# Patient Record
Sex: Male | Born: 1990 | Race: White | Hispanic: No | Marital: Single | Smoking: Never smoker
Health system: Southern US, Community
[De-identification: ages and names within clinical notes are randomized; demographics above are authoritative.]

## PROBLEM LIST (undated history)

## (undated) DIAGNOSIS — S060X9A Concussion with loss of consciousness of unspecified duration, initial encounter: Secondary | ICD-10-CM

## (undated) DIAGNOSIS — IMO0002 Reserved for concepts with insufficient information to code with codable children: Secondary | ICD-10-CM

## (undated) DIAGNOSIS — S24103A Unspecified injury at T7-T10 level of thoracic spinal cord, initial encounter: Secondary | ICD-10-CM

## (undated) DIAGNOSIS — F4310 Post-traumatic stress disorder, unspecified: Secondary | ICD-10-CM

## (undated) DIAGNOSIS — S060XAA Concussion with loss of consciousness status unknown, initial encounter: Secondary | ICD-10-CM

## (undated) DIAGNOSIS — K592 Neurogenic bowel, not elsewhere classified: Secondary | ICD-10-CM

## (undated) DIAGNOSIS — S14109A Unspecified injury at unspecified level of cervical spinal cord, initial encounter: Secondary | ICD-10-CM

## (undated) DIAGNOSIS — G904 Autonomic dysreflexia: Secondary | ICD-10-CM

## (undated) DIAGNOSIS — N319 Neuromuscular dysfunction of bladder, unspecified: Secondary | ICD-10-CM

## (undated) DIAGNOSIS — S24102A Unspecified injury at T2-T6 level of thoracic spinal cord, initial encounter: Secondary | ICD-10-CM

## (undated) DIAGNOSIS — N4400 Torsion of testis, unspecified: Secondary | ICD-10-CM

## (undated) HISTORY — PX: OTHER SURGICAL HISTORY: SHX169

## (undated) HISTORY — PX: FOOT SURGERY: SHX648

## (undated) HISTORY — PX: TYMPANOSTOMY TUBE PLACEMENT: SHX32

## (undated) HISTORY — PX: CALCANEAL OSTEOTOMY W/ INTERNAL FIXATION: SHX1282

## (undated) HISTORY — PX: KNEE SURGERY: SHX244

## (undated) HISTORY — PX: OSTEOTOMY TARSAL: SUR991

## (undated) HISTORY — PX: TESTICLE SURGERY: SHX794

## (undated) HISTORY — PX: GASTROCNEMIUS RECESSION: SUR1071

## (undated) HISTORY — PX: NECK SURGERY: SHX720

## (undated) HISTORY — PX: POSTERIOR FUSION CERVICAL SPINE: SUR628

---
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
## 2011-06-14 ENCOUNTER — Encounter: Payer: Self-pay | Admitting: *Deleted

## 2011-06-14 ENCOUNTER — Emergency Department (HOSPITAL_BASED_OUTPATIENT_CLINIC_OR_DEPARTMENT_OTHER)
Admission: EM | Admit: 2011-06-14 | Discharge: 2011-06-14 | Disposition: A | Discharge status: Home / Self Care | Payer: BC Managed Care – PPO | Attending: Emergency Medicine | Admitting: Emergency Medicine

## 2011-06-14 ENCOUNTER — Emergency Department (INDEPENDENT_AMBULATORY_CARE_PROVIDER_SITE_OTHER): Payer: BC Managed Care – PPO

## 2011-06-14 DIAGNOSIS — N509 Disorder of male genital organs, unspecified: Secondary | ICD-10-CM | POA: Insufficient documentation

## 2011-06-14 DIAGNOSIS — N50819 Testicular pain, unspecified: Secondary | ICD-10-CM

## 2011-06-14 DIAGNOSIS — N508 Other specified disorders of male genital organs: Secondary | ICD-10-CM

## 2011-06-14 HISTORY — DX: Torsion of testis, unspecified: N44.00

## 2011-06-14 LAB — URINALYSIS, ROUTINE W REFLEX MICROSCOPIC
Leukocytes, UA: NEGATIVE
Protein, ur: 30 mg/dL — AB
Urobilinogen, UA: 0.2 mg/dL (ref 0.0–1.0)

## 2011-06-14 LAB — URINE MICROSCOPIC-ADD ON

## 2011-06-14 NOTE — ED Provider Notes (Signed)
History     CSN: [id number] Arrival date & time: 06/14/2011  6:32 PM   Chief Complaint  Patient presents with  . Testicle Pain     (Include location/radiation/quality/duration/timing/severity/associated sxs/prior treatment) HPI Pt reports 2 days of gradually worsening R testicular pain, sharp, severe, worse with walking and movement. Has history of testicular torsion repair on that side about a year ago. Feels similar. Since his prior surgery he has also had difficulty with urinary retention, has to self-cath daily. Recently seen at [facility] for same, awaiting further workup there.   Past Medical History  Diagnosis Date  . Testicular torsion      Past Surgical History  Procedure Date  . Testicle surgery     History reviewed. No pertinent family history.  History  Substance Use Topics  . Smoking status: Never Smoker   . Smokeless tobacco: Not on file  . Alcohol Use: No      Review of Systems All other systems reviewed and are negative except as noted in HPI.   Allergies  Nabumetone  Home Medications  No current outpatient prescriptions on file.  Physical Exam    BP 150/77  Pulse 94  Temp(Src) 98.4 F (36.9 C) (Oral)  Resp 16  Ht 5\' 7"  (1.702 m)  Wt 211 lb (95.709 kg)  BMI 33.05 kg/m2  Physical Exam  Nursing note and vitals reviewed. Constitutional: He is oriented to person, place, and time. He appears well-developed and well-nourished.  HENT:  Head: Normocephalic and atraumatic.  Eyes: EOM are normal. Pupils are equal, round, and reactive to light.  Neck: Normal range of motion. Neck supple.  Cardiovascular: Normal rate, normal heart sounds and intact distal pulses.   Pulmonary/Chest: Effort normal and breath sounds normal.  Abdominal: Bowel sounds are normal. He exhibits no distension. There is no tenderness.  Genitourinary:       R testicular tenderness, normal orientation of testes, no masses, cremasteric reflex absent bilaterally due to contracted  scrotum, no penile discharge, no hernia  Musculoskeletal: Normal range of motion. He exhibits no edema and no tenderness.  Neurological: He is alert and oriented to person, place, and time. He has normal strength. No cranial nerve deficit or sensory deficit.  Skin: Skin is warm and dry. No rash noted.  Psychiatric: He has a normal mood and affect.    ED Course  Procedures  Results for orders placed during the hospital encounter of 06/14/11  URINALYSIS, ROUTINE W REFLEX MICROSCOPIC      Component Value Range   Color, Urine YELLOW  YELLOW    Appearance CLEAR  CLEAR    Specific Gravity, Urine 1.026  1.005 - 1.030    pH 6.0  5.0 - 8.0    Glucose, UA NEGATIVE  NEGATIVE (mg/dL)   Hgb urine dipstick NEGATIVE  NEGATIVE    Bilirubin Urine NEGATIVE  NEGATIVE    Ketones, ur NEGATIVE  NEGATIVE (mg/dL)   Protein, ur 30 (*) NEGATIVE (mg/dL)   Urobilinogen, UA 0.2  0.0 - 1.0 (mg/dL)   Nitrite NEGATIVE  NEGATIVE    Leukocytes, UA NEGATIVE  NEGATIVE   URINE MICROSCOPIC-ADD ON      Component Value Range   Squamous Epithelial / LPF RARE  RARE    Bacteria, UA RARE  RARE    US Scrotum  06/14/2011  *RADIOLOGY REPORT*  Clinical Data:  Right scrotal pain for 2 days.  History of testicular torsion surgically repaired 1 year ago.  SCROTAL ULTRASOUND DOPPLER ULTRASOUND OF THE TESTICLES  Technique: Complete ultrasound examination of the testicles, epididymis, and other scrotal structures was performed.  Color and spectral Doppler ultrasound were also utilized to evaluate blood flow to the testicles.  Comparison:  None available.  Findings:  Right testis:  Normal.  3.5 x 2.0 x 2.8 cm.  Left testis:  Normal.  3.5 x 2.1 x 2.7 cm.  Right epididymis:  Normal in size and appearance.  Left epididymis:  Normal in size and appearance.  Hydocele:  None present.  Varicocele:  None present.  Pulsed Doppler interrogation of both testes demonstrates low resistance flow bilaterally.  IMPRESSION: Negative bilateral scrotal  ultrasound.  Original Report Authenticated By: Jamesetta Orleans. MATTERN, M.D.   [place] Art/ven Flow Abd Pelv Doppler  06/14/2011  *RADIOLOGY REPORT*  Clinical Data:  Right scrotal pain for 2 days.  History of testicular torsion surgically repaired 1 year ago.  SCROTAL ULTRASOUND DOPPLER ULTRASOUND OF THE TESTICLES  Technique: Complete ultrasound examination of the testicles, epididymis, and other scrotal structures was performed.  Color and spectral Doppler ultrasound were also utilized to evaluate blood flow to the testicles.  Comparison:  None available.  Findings:  Right testis:  Normal.  3.5 x 2.0 x 2.8 cm.  Left testis:  Normal.  3.5 x 2.1 x 2.7 cm.  Right epididymis:  Normal in size and appearance.  Left epididymis:  Normal in size and appearance.  Hydocele:  None present.  Varicocele:  None present.  Pulsed Doppler interrogation of both testes demonstrates low resistance flow bilaterally.  IMPRESSION: Negative bilateral scrotal ultrasound.  Original Report Authenticated By: Jamesetta Orleans. MATTERN, M.D.       MDM Pt's UA and [place] are both normal. Pt advised to support scrotum, take pain medications as needed, followup with urology if pain persists.        Charles B. Bernette Mayers, MD 06/14/11 2039

## 2011-06-14 NOTE — ED Notes (Signed)
Pt c/o right testicle pain, HX testicle tortion

## 2011-06-16 LAB — URINE CULTURE

## 2011-06-17 NOTE — ED Notes (Signed)
+   URINE CULTURE. CHART SENT TO EDP OFFICE FOR REVIEW

## 2012-08-26 DIAGNOSIS — R569 Unspecified convulsions: Secondary | ICD-10-CM | POA: Insufficient documentation

## 2013-01-25 DIAGNOSIS — F909 Attention-deficit hyperactivity disorder, unspecified type: Secondary | ICD-10-CM | POA: Insufficient documentation

## 2013-05-06 ENCOUNTER — Encounter (HOSPITAL_COMMUNITY): Payer: Self-pay | Admitting: *Deleted

## 2013-05-06 ENCOUNTER — Other Ambulatory Visit: Payer: Self-pay | Admitting: Urology

## 2013-05-06 ENCOUNTER — Emergency Department (HOSPITAL_COMMUNITY)
Admission: EM | Admit: 2013-05-06 | Discharge: 2013-05-06 | Disposition: A | Discharge status: Home / Self Care | Payer: BC Managed Care – PPO | Attending: Emergency Medicine | Admitting: Emergency Medicine

## 2013-05-06 DIAGNOSIS — Z8669 Personal history of other diseases of the nervous system and sense organs: Secondary | ICD-10-CM | POA: Insufficient documentation

## 2013-05-06 DIAGNOSIS — Z4789 Encounter for other orthopedic aftercare: Secondary | ICD-10-CM

## 2013-05-06 DIAGNOSIS — Z79899 Other long term (current) drug therapy: Secondary | ICD-10-CM | POA: Insufficient documentation

## 2013-05-06 DIAGNOSIS — Z87448 Personal history of other diseases of urinary system: Secondary | ICD-10-CM | POA: Insufficient documentation

## 2013-05-06 DIAGNOSIS — R109 Unspecified abdominal pain: Secondary | ICD-10-CM

## 2013-05-06 DIAGNOSIS — Z4689 Encounter for fitting and adjustment of other specified devices: Secondary | ICD-10-CM | POA: Insufficient documentation

## 2013-05-06 DIAGNOSIS — R209 Unspecified disturbances of skin sensation: Secondary | ICD-10-CM | POA: Insufficient documentation

## 2013-05-06 DIAGNOSIS — N319 Neuromuscular dysfunction of bladder, unspecified: Secondary | ICD-10-CM

## 2013-05-06 DIAGNOSIS — M79609 Pain in unspecified limb: Secondary | ICD-10-CM | POA: Insufficient documentation

## 2013-05-06 HISTORY — DX: Neuromuscular dysfunction of bladder, unspecified: N31.9

## 2013-05-06 NOTE — ED Notes (Signed)
Pt states that he had surgery on Friday to repair his foot; pt states that for 2 days he has had swelling and decreased feeling to his rt foot; pt states "My foot feels dead"; pt reports that he called his surgeon today and reports that he was instructed to come to the ER to have the cast redone; Pt had surgery at [facility].

## 2013-05-06 NOTE — ED Provider Notes (Signed)
CSN: [id number]     Arrival date & time 05/06/13  1912 History     First MD Initiated Contact with Patient 05/06/13 1932     Chief Complaint  Patient presents with  . Post-op Problem   (Consider location/radiation/quality/duration/timing/severity/associated sxs/prior Treatment) HPI Comments: Patient presents today with a complaint that his cast is too tight.  He has reconstructive surgery done on the right foot four days ago and had the cast placed then.  Surgery was performed by Dr. Lorin Picket with [facility].  He states that ever since the cast was placed his right foot had felt numb and that he has had pain in the achilles area of his foot.  He has been taking Oxycodone for the pain, which is helping.  He states that he called the office of Dr. Lorin Picket earlier today and was told to come to the ED to have the cast removed.  He states that he has an appointment scheduled with his Orthopedist tomorrow.  He denies fever or chills.    The history is provided by the patient.    Past Medical History  Diagnosis Date  . Testicular torsion   . Muscular dystrophy   . Neurogenic bladder disorder    Past Surgical History  Procedure Laterality Date  . Testicle surgery    . Foot surgery    . Knee surgery    . Neck surgery     No family history on file. History  Substance Use Topics  . Smoking status: Never Smoker   . Smokeless tobacco: Not on file  . Alcohol Use: No    Review of Systems  Musculoskeletal:       Right foot pain  Neurological: Positive for numbness.  All other systems reviewed and are negative.    Allergies  Citalopram; Nabumetone; Nitrofurantoin monohyd macro; and Tizanidine  Home Medications   Current Outpatient Rx  Name  Route  Sig  Dispense  Refill  . baclofen (LIORESAL) 10 MG tablet   Oral   Take 10 mg by mouth 3 (three) times daily.         Marland Kitchen oxyCODONE (OXYCONTIN) 20 MG 12 hr tablet   Oral   Take 20 mg by mouth every 12 (twelve) hours.         .  Oxycodone HCl 10 MG TABS   Oral   Take 10 mg by mouth every 4 (four) hours as needed (for pain).         . promethazine (PHENERGAN) 12.5 MG tablet   Oral   Take 12.5 mg by mouth every 6 (six) hours as needed for nausea.         . vitamin B-12 (CYANOCOBALAMIN) 1000 MCG tablet   Oral   Take 1,000 mcg by mouth daily.          BP 142/101  Pulse 100  Temp(Src) 98.3 F (36.8 C) (Oral)  Resp 20  Ht 5\' 6"  (1.676 m)  Wt 208 lb (94.348 kg)  BMI 33.59 kg/m2  SpO2 98% Physical Exam  Nursing note and vitals reviewed. Constitutional: He appears well-developed and well-nourished.  HENT:  Head: Normocephalic and atraumatic.  Mouth/Throat: Oropharynx is clear and moist.  Cardiovascular: Normal rate, regular rhythm and normal heart sounds.   Pulmonary/Chest: Effort normal and breath sounds normal.  Neurological: He is alert.  Decreased sensation of all toes of the right foot  Skin: Skin is warm and dry.  Case of the right foot and lower leg intact.  Good  capillary refill of all toes of the right foot.  Toes are warm and dry.  Normal in color.  Psychiatric: He has a normal mood and affect.    ED Course   Procedures (including critical care time)  Labs Reviewed - No data to display No results found. No diagnosis found.  Side of the cast cut by Orthopedic Tech and wrapped with ACE wrap.  Cast kept in place.  After cast cut the patient was able to wiggle all toes and sensation of all toes of the right foot was intact.  MDM  Patient presents with a complaint that his cast is too tight and is causing discomfort of his foot and numbness of his toes.  Orthopedic tech evaluated the cast and made a cut along the side of the cast.  Case was not removed.  After this was done, the patient reported that his discomfort had significantly improved and his distal sensation of all toes on the right foot were intact.  Patient afebrile.  Patient has follow up appointment with his Orthopedist tomorrow.     Pascal Lux [place], PA-C 05/07/13 1243

## 2013-05-07 NOTE — ED Provider Notes (Signed)
Medical screening examination/treatment/procedure(s) were performed by non-physician practitioner and as supervising physician I was immediately available for consultation/collaboration.   Saim Almanza L Brexlee Heberlein, MD 05/07/13 2305 

## 2013-05-12 ENCOUNTER — Ambulatory Visit (HOSPITAL_COMMUNITY)
Admission: RE | Admit: 2013-05-12 | Discharge: 2013-05-12 | Disposition: A | Discharge status: Home / Self Care | Payer: BC Managed Care – PPO | Source: Ambulatory Visit | Attending: Urology | Admitting: Urology

## 2013-05-12 DIAGNOSIS — N319 Neuromuscular dysfunction of bladder, unspecified: Secondary | ICD-10-CM | POA: Insufficient documentation

## 2013-05-12 DIAGNOSIS — R109 Unspecified abdominal pain: Secondary | ICD-10-CM

## 2013-05-12 MED ORDER — DIATRIZOATE MEGLUMINE 30 % UR SOLN
Freq: Once | URETHRAL | Status: AC | PRN
  Administered 2013-05-12: 300 mL

## 2013-11-28 ENCOUNTER — Emergency Department (HOSPITAL_COMMUNITY): Payer: BC Managed Care – PPO

## 2013-11-28 ENCOUNTER — Encounter (HOSPITAL_COMMUNITY): Payer: Self-pay | Admitting: Emergency Medicine

## 2013-11-28 ENCOUNTER — Emergency Department (HOSPITAL_COMMUNITY)
Admission: EM | Admit: 2013-11-28 | Discharge: 2013-11-28 | Disposition: A | Discharge status: Home / Self Care | Payer: BC Managed Care – PPO | Attending: Emergency Medicine | Admitting: Emergency Medicine

## 2013-11-28 DIAGNOSIS — Z79899 Other long term (current) drug therapy: Secondary | ICD-10-CM | POA: Insufficient documentation

## 2013-11-28 DIAGNOSIS — Z791 Long term (current) use of non-steroidal anti-inflammatories (NSAID): Secondary | ICD-10-CM | POA: Insufficient documentation

## 2013-11-28 DIAGNOSIS — R202 Paresthesia of skin: Secondary | ICD-10-CM

## 2013-11-28 DIAGNOSIS — Z87448 Personal history of other diseases of urinary system: Secondary | ICD-10-CM | POA: Insufficient documentation

## 2013-11-28 DIAGNOSIS — Z8669 Personal history of other diseases of the nervous system and sense organs: Secondary | ICD-10-CM | POA: Insufficient documentation

## 2013-11-28 DIAGNOSIS — R209 Unspecified disturbances of skin sensation: Secondary | ICD-10-CM | POA: Insufficient documentation

## 2013-11-28 DIAGNOSIS — R200 Anesthesia of skin: Secondary | ICD-10-CM

## 2013-11-28 MED ORDER — LORAZEPAM 1 MG PO TABS
1.0000 mg | ORAL_TABLET | Freq: Once | ORAL | Status: DC
  Filled 2013-11-28: qty 1

## 2013-11-28 NOTE — ED Provider Notes (Signed)
CSN: [id number]     Arrival date & time 11/28/13  9562 History   First MD Initiated Contact with Patient 11/28/13 [phone]     Chief Complaint  Patient presents with  . Numbness     (Consider location/radiation/quality/duration/timing/severity/associated sxs/prior Treatment) HPI  This is a 23 year old male with history of muscular dystrophy and spasticity who presents with right facial and arm numbness. Patient reports onset of symptoms yesterday. He denies any weakness or difficulty speaking. He is followed by neurology at [facility] and they advised him to seek emergent evaluation. Patient denies any speech difficulties. He denies any facial asymmetry. He states that his right face and arm "felt tingly and numb." He denies any decreased grip strength. He has a recent fevers or illnesses. He denies any neck pain.  Past Medical History  Diagnosis Date  . Testicular torsion   . Muscular dystrophy   . Neurogenic bladder disorder    Past Surgical History  Procedure Laterality Date  . Testicle surgery    . Foot surgery    . Knee surgery    . Neck surgery    . Tympanostomy tube placement    . Hip surgery    . Posterior fusion cervical spine    . Calcaneal osteotomy w/ internal fixation    . Gastrocnemius recession    . Osteotomy tarsal     No family history on file. History  Substance Use Topics  . Smoking status: Never Smoker   . Smokeless tobacco: Not on file  . Alcohol Use: No    Review of Systems  Constitutional: Negative.  Negative for fever.  Respiratory: Negative.  Negative for chest tightness and shortness of breath.   Cardiovascular: Negative.  Negative for chest pain.  Gastrointestinal: Negative.  Negative for vomiting, abdominal pain and diarrhea.  Genitourinary: Negative.  Negative for dysuria.  Musculoskeletal: Negative for back pain.  Skin: Negative for rash.  Neurological: Positive for numbness. Negative for dizziness, facial asymmetry, weakness, light-headedness and  headaches.  All other systems reviewed and are negative.      Allergies  Citalopram; Nabumetone; Nitrofurantoin monohyd macro; and Tizanidine  Home Medications   Current Outpatient Rx  Name  Route  Sig  Dispense  Refill  . baclofen (LIORESAL) 10 MG tablet   Oral   Take 10 mg by mouth 3 (three) times daily. Takes 3 tablets three times a day and 1/2 tablet at bedtime         . HYDROcodone-acetaminophen (NORCO/VICODIN) 5-325 MG per tablet   Oral   Take 1 tablet by mouth every 4 (four) hours as needed for moderate pain.         . naproxen (NAPROSYN) 500 MG tablet   Oral   Take 500 mg by mouth 3 (three) times daily.          BP 139/82  Pulse 97  Temp(Src) 98.3 F (36.8 C) (Oral)  Resp 14  SpO2 97% Physical Exam  Nursing note and vitals reviewed. Constitutional: He is oriented to person, place, and time. No distress.  HENT:  Head: Normocephalic and atraumatic.  Mouth/Throat: Oropharynx is clear and moist.  Eyes: Pupils are equal, round, and reactive to light.  Neck: Neck supple.  Cardiovascular: Normal rate, regular rhythm and normal heart sounds.   No murmur heard. Pulmonary/Chest: Effort normal and breath sounds normal. No respiratory distress. He has no wheezes.  Abdominal: Soft. Bowel sounds are normal. There is no tenderness. There is no rebound.  Musculoskeletal: He exhibits no  edema.  Lymphadenopathy:    He has no cervical adenopathy.  Neurological: He is alert and oriented to person, place, and time. No cranial nerve deficit.  Coordination intact finger-nose-finger, gross sensation intact with subjective difference in touch over the right face and right upper extremity, 5 out of 5 strength in bilateral upper extremities, 4+ out of 5 strength in bilateral lower extremities  Skin: Skin is warm and dry.  Psychiatric: He has a normal mood and affect.    ED Course  Procedures (including critical care time) Labs Review Labs Reviewed - No data to  display Imaging Review Ct Head Wo Contrast  11/28/2013   CLINICAL DATA:  NUMBNESS  EXAM: CT HEAD WITHOUT CONTRAST  TECHNIQUE: Contiguous axial images were obtained from the base of the skull through the vertex without intravenous contrast.  COMPARISON:  MR BRAIN LTD W/O CM dated 11/28/2013; CT HEAD W/O CM dated 10/18/2012  FINDINGS: No acute intracranial abnormality. Specifically, no hemorrhage, hydrocephalus, mass lesion, acute infarction, or significant intracranial injury. No acute calvarial abnormality. Cervical spine fusion hardware is partially visualized within the proximal cervical spine. The visualized paranasal sinuses and mastoid air cells are patent.  IMPRESSION: No acute intracranial abnormality.   Electronically Signed   By: Salome HolmesHector  Cooper M.D.   On: 11/28/2013 13:39   Mr Brain Ltd W/o Cm  11/28/2013   CLINICAL DATA:  Right facial numbness.  Muscular dystrophy.  EXAM: MRI HEAD WITHOUT CONTRAST LIMITED  TECHNIQUE: Multiplanar, multiecho pulse sequences of the brain and surrounding structures were obtained without intravenous contrast.  COMPARISON:  CT head 10/18/2012  FINDINGS: Sagittal T1 imaging only was obtained. The patient had electrical sensation extending down both arms during scanning. Scanning was terminated.  The patient does have posterior hardware fusion in the upper cervical spine. 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 It is possible this hardware cause the patient's sensation.  Limited imaging of the brain reveals normal ventricles. No obvious mass. Diffusion-weighted imaging was not performed today and axial T2 images were not performed. Adenoid hypertrophy.  IMPRESSION: This study is limited to sagittal T1 imaging only as the patient complained electrical sensations down both arms during scanning. There has been prior posterior hardware fusion of the cervical spine. Consider CT head for followup.   Electronically Signed   By: Marlan Palauharles  Clark M.D.   On: 11/28/2013 12:31     EKG  Interpretation   Date/Time:  Friday November 28 2013 09:35:51 EST Ventricular Rate:  90 PR Interval:  151 QRS Duration: 87 QT Interval:  350 QTC Calculation: 428 R Axis:   18 Text Interpretation:  Sinus rhythm Baseline wander in lead(s) II Confirmed  by Lenola Lockner  MD, Talbert Trembath ([id number]) on 11/28/2013 9:39:09 AM      MDM   Final diagnoses:  Numbness and tingling   Patient presents with numbness and tingling of the right face and right arm. He is nonfocal on exam. No significant objective findings; however, patient subjectively states the right face and right arm felt different. No risk factors for stroke. MRI was obtained. Patient was unable to tolerate MRI secondary to pierce the use of the hands when the magnet was turned on. CT scan of the head is negative. D[facility] the [facility] Orlene ErmHawes as well as Dr. Roseanne RenoStewart and [facility] He has a scheduled appointment. Patient was given strict return cautions including new weakness, numbness, tingling, or any other focal deficit.  After history, exam, and medical workup I feel the patient has been appropriately medically screened and is safe for discharge home. Pertinent diagnoses were discussed with the patient. Patient was given return precautions.     Shon Baton, MD 11/29/13 [phone]

## 2013-11-28 NOTE — ED Notes (Signed)
Patient transported to MRI 

## 2013-11-28 NOTE — ED Notes (Signed)
Patient tried to self cath himself and was unsuccessful. Notified RN.

## 2013-11-28 NOTE — Discharge Instructions (Signed)
Paresthesia °Paresthesia is an abnormal burning or prickling sensation. This sensation is generally felt in the hands, arms, legs, or feet. However, it may occur in any part of the body. It is usually not painful. The feeling may be described as: °· Tingling or numbness. °· "Pins and needles." °· Skin crawling. °· Buzzing. °· Limbs "falling asleep." °· Itching. °Most people experience temporary (transient) paresthesia at some time in their lives. °CAUSES  °Paresthesia may occur when you breathe too quickly (hyperventilation). It can also occur without any apparent cause. Commonly, paresthesia occurs when pressure is placed on a nerve. The feeling quickly goes away once the pressure is removed. For some people, however, paresthesia is a long-lasting (chronic) condition caused by an underlying disorder. The underlying disorder may be: °· A traumatic, direct injury to nerves. Examples include a: °· Broken (fractured) neck. °· Fractured skull. °· A disorder affecting the brain and spinal cord (central nervous system). Examples include: °· Transverse myelitis. °· Encephalitis. °· Transient ischemic attack. °· Multiple sclerosis. °· Stroke. °· Tumor or blood vessel problems, such as an arteriovenous malformation pressing against the brain or spinal cord. °· A condition that damages the peripheral nerves (peripheral neuropathy). Peripheral nerves are not part of the brain and spinal cord. These conditions include: °· Diabetes. °· Peripheral vascular disease. °· Nerve entrapment syndromes, such as carpal tunnel syndrome. °· Shingles. °· Hypothyroidism. °· Vitamin B12 deficiencies. °· Alcoholism. °· Heavy metal poisoning (lead, arsenic). °· Rheumatoid arthritis. °· Systemic lupus erythematosus. °DIAGNOSIS  °Your caregiver will attempt to find the underlying cause of your paresthesia. Your caregiver may: °· Take your medical history. °· Perform a physical exam. °· Order various lab tests. °· Order imaging tests. °TREATMENT    °Treatment for paresthesia depends on the underlying cause. °HOME CARE INSTRUCTIONS °· Avoid drinking alcohol. °· You may consider massage or acupuncture to help relieve your symptoms. °· Keep all follow-up appointments as directed by your caregiver. °SEEK IMMEDIATE MEDICAL CARE IF:  °· You feel weak. °· You have trouble walking or moving. °· You have problems with speech or vision. °· You feel confused. °· You cannot control your bladder or bowel movements. °· You feel numbness after an injury. °· You faint. °· Your burning or prickling feeling gets worse when walking. °· You have pain, cramps, or dizziness. °· You develop a rash. °MAKE SURE YOU: °· Understand these instructions. °· Will watch your condition. °· Will get help right away if you are not doing well or get worse. °Document Released: 09/01/2002 Document Revised: 12/04/2011 Document Reviewed: 06/02/2011 °Exit[employer]® Patient Information ©2014 [employer]. ° °

## 2013-11-28 NOTE — ED Notes (Addendum)
Pt c/o "seizure like activity" x 4 days, R side facial and R arm numbness x 1 day, and increasing extremity pain x 1 day.  Pain score 6/10.  Hx of muscular dystrophy and tremors.  Reports "my doctors are [facility] are in the process of diagnosing me with Cerebral Palsy.  They want me evaluated for mini strokes."

## 2013-11-28 NOTE — Progress Notes (Signed)
Pt was placed into MRI scanner. Pt complained of bi-lat hand tingling during the first sequence, Pt stated that it was a light shock and sudden onset of tingling in both hands while the magnet was running.  At this point the pt was informed to make sure that his hands were not touching while in the scanner. Tech informed the pt that if he experienced this phenomenon again during the start of the DWI sequence that he needed to immediately squeeze the ball to inform the technologist. As soon as the RF was applied to the pt during the DWI sequence the patient once again squeezed the ball. Stating that he was again experiencing this light shocking sensation in his upper extremities. At this point patient was pulled out of the scanner and all scanning was stopped. Pt was agreeable that he did not want to continue with scanning while the symptoms occured. Technologist Gardiner CoinsAaron Emelynn Rance called and spoke with Dr Wilkie AyeHorton @12 :01pm. I informed the physician that I was unable to continue scanning this patient while this was happening. I spoke with Dr Alfredo BattyMattern Neuroradiologist and informed him of this. He instructed to turn in the 1 series that i was able to obtain. Which was  Sag T-1 weighted image. Order was changed to a MR Brain LTD for completion purposes.

## 2014-02-25 ENCOUNTER — Other Ambulatory Visit: Payer: Self-pay | Admitting: Urology

## 2014-02-25 ENCOUNTER — Ambulatory Visit (HOSPITAL_COMMUNITY)
Admission: RE | Admit: 2014-02-25 | Discharge: 2014-02-25 | Disposition: A | Discharge status: Home / Self Care | Payer: BC Managed Care – PPO | Source: Ambulatory Visit | Attending: Urology | Admitting: Urology

## 2014-02-25 DIAGNOSIS — R209 Unspecified disturbances of skin sensation: Secondary | ICD-10-CM

## 2014-02-25 DIAGNOSIS — G7109 Other specified muscular dystrophies: Secondary | ICD-10-CM | POA: Insufficient documentation

## 2014-02-25 DIAGNOSIS — G839 Paralytic syndrome, unspecified: Secondary | ICD-10-CM | POA: Insufficient documentation

## 2014-02-25 DIAGNOSIS — G8220 Paraplegia, unspecified: Secondary | ICD-10-CM

## 2014-06-15 ENCOUNTER — Telehealth: Payer: Self-pay | Admitting: Neurology

## 2014-06-15 NOTE — Telephone Encounter (Signed)
Pt called cancelled appt for 06-16-14 and resch for 07-15-14

## 2014-06-16 ENCOUNTER — Ambulatory Visit: Payer: BC Managed Care – PPO | Admitting: Neurology

## 2014-07-02 ENCOUNTER — Ambulatory Visit (INDEPENDENT_AMBULATORY_CARE_PROVIDER_SITE_OTHER): Payer: BC Managed Care – PPO | Admitting: Neurology

## 2014-07-02 ENCOUNTER — Encounter: Payer: Self-pay | Admitting: Neurology

## 2014-07-02 VITALS — BP 130/90 | HR 95 | Resp 18 | Ht 69.0 in | Wt 199.0 lb

## 2014-07-02 DIAGNOSIS — M6249 Contracture of muscle, multiple sites: Secondary | ICD-10-CM

## 2014-07-02 DIAGNOSIS — M62838 Other muscle spasm: Secondary | ICD-10-CM

## 2014-07-02 DIAGNOSIS — N319 Neuromuscular dysfunction of bladder, unspecified: Secondary | ICD-10-CM

## 2014-07-02 DIAGNOSIS — F447 Conversion disorder with mixed symptom presentation: Secondary | ICD-10-CM

## 2014-07-02 NOTE — Progress Notes (Signed)
NEUROLOGY CONSULTATION NOTE  Juan Blake MRN: [id number] DOB: 07/16/1991  Referring provider: Dr. West Carbo  Primary care provider: Dr. West Carbo  Reason for consult:  Numbness, tingling in both legs, spasticity  Dear Dr Franne Forts:  Thank you for your kind referral of Juan Blake for consultation of the above symptoms. Although his history is well known to you, please allow me to reiterate it for the purpose of our medical record. The patient was accompanied to the clinic by his friend who also provides collateral information. Records from [place] were available for review, records from [place] unavailable at this time.  HISTORY OF PRESENT ILLNESS: This is a 23 year old left-handed man presenting for bilateral lower extremity numbness and spasticity. He has seen numerous specialists at [facility], [facility], [place], and most recently at [facility].  He has been seeing a PMR specialist at [facility] and pain specialist at [facility].  I will summarize his extensive workup for our records. He was adopted and does not know his family history, but reports that he had delayed walking and language.  He had difficulties with both lower extremities with multiple tendon releases/tranfers due to contractures. He used AFOs and crutches.  Around age 22, he began having urinary and stool retention, and has been doing self-catheterization.  He started having worsening spasticity and leg weakness and was tried on different medications for spasticity, Baclofen is the only medication that helps.  He reports that a Baclofen pump was planned at [facility] in November, however he has recently been told that he is not a candidate and wonders why this is the case.    He has had problems with sensation for the past 4 years, with difficulty differentiating hot and cold.  He has had several MRIs of the spine which do not show any cord abnormalities.  He also reports pain in his proximal muscles in his thighs and shoulder girdle area  as well as his lower back. Sometimes his fingers would curl up for several hours. 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 He has reported loss of sensation to both legs and has has several spine MRIs: MRI C-spine from 03/2014 showed normal spinal cord with no abnormal enhancement, status post C1-C2 posterior rod and screw fixation. Limited evaluation secondary to susceptibility artifact. Os odontoideum, small central disc protrusion with no canal or foraminal stenosis at C5-6 with disc desiccation, disc desiccation at T1-T2. He has had 2 thoracic and lumbar MRIs done in the past 3 months, most recently 06/25/2014: MRI T-spine showed slight progression of the left T10-T11 subarticular recess/foraminal disc extrusion at T10-T11 which moderately effaces the left subarticular recess and exerts mass effect on the left T10 and T11 nerve roots; similar tiny focal eccentric left disc protrusion at T5-T6 which does not cause significant canal or foraminal stenosis. MRI L-spine showed facet hypertrophy at L5-S1 contributes to minimal bilateral foraminal stenosis, similar to 04/09/2014.  He has seen multiple neurologists at [facility], per records from [facility], "they all obtained a fairly variable exam and were not able to determine any underlying etiology. 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 He has had a full spastic paraparesis panel at [facility] which was negative. It was felt that he does not have a myelopathy because his symptoms are too  paroxysmal to be explained by that. No evidence of CMT with normal EMG/NCV.  Last notes were leaning towards this being a functional neurologic syndrome (conversion disorder) but await genetic sequencing for SCN4a to rule out a periodic paralysis. Records from his PCP were reviewed, patient has been resistant to diagnosis of conversion disorder and has had side effects Effexor.  He presents today reporting continuous numbness in both legs. He woke up last week with "no feeling in both legs" and had the MRI. He is concerned that he has an "incomplete L1-L2 injury," we went over MRI results today, no cord abnormality seen.  He reports the numbness is from his thighs down to his feet. He continues to self-cath and will be seeing his urologist tomorrow. He also reports bowel incontinence. He is sitting in a wheelchair today, but reports that he uses a cane at home. He has not been able to drive due to the non-epileptic shaking episodes. He tells me today that what made him upset with [facility] was he was told he needed a spinal tap, then was told after that they cannot do it.  I cannot find any records of this discussion.    Laboratory Data available for review: CBC with differential, 12/21/11: WBC 8.5, hemoglobin 16, hematocrit 48.6, platelets 161, ANC 5 ESR, 12/21/11: 7 CMP, 12/21/11: Sodium 139, potassium 4.4, chloride 100, carbon dioxide 27, glucose 102, BUN 14, creatinine 0.66, bilirubin 0.4, alkaline phosphatase 81, AST 23, ALT 37, total protein 7.9, albumin 5.4, calcium 9.9 Thyroid labs, 12/21/11: TSH 1.54, T4 9.4 Vitamin B12, 12/21/11: 317 RPR, 12/21/11: Nonreactive Homocysteine, 12/21/11:17 Very long chain fatty acid assay performed at [place] on 09/15/2013 was normal HTLV-I/II, 09/15/2013: Negative Rheumatoid factor, 07/25/2013: Less than 20 Hereditary spastic paraplegia genetic panel ordered at [facility] 09/15/2013: reportedly negative per patient, unable to view in [place].  MRI brain with and without  contrast, report reviewed in [place] from [place], 12/09/2013: Unremarkable MRI of the brain.  EMG, 12/01/2013, performed at [place]:  This is a normal study. There is no electrodiagnostic evidence to suggest a large fiber polyneuropathy.  PAST MEDICAL HISTORY: Past Medical History  Diagnosis Date  . Testicular torsion   . Neurogenic bladder disorder     PAST SURGICAL HISTORY: Past Surgical History  Procedure Laterality Date  . Testicle surgery    . Foot surgery    . Knee surgery    . Neck surgery    . Tympanostomy tube placement    . Hip surgery    . Posterior fusion cervical spine    . Calcaneal osteotomy w/ internal fixation    . Gastrocnemius recession    . Osteotomy tarsal      MEDICATIONS: Current Outpatient Prescriptions on File Prior to Visit  Medication Sig Dispense Refill  . baclofen (LIORESAL) 10 MG tablet Take 10 mg by mouth 3 (three) times daily. Takes 3 tablets three times a day and 1/2 tablet at bedtime      . HYDROcodone-acetaminophen (NORCO/VICODIN) 5-325 MG per tablet Take 1 tablet by mouth every 4 (four) hours as needed for moderate pain.      . naproxen (NAPROSYN) 500 MG tablet Take 500 mg by mouth 3 (three) times daily.  No current facility-administered medications on file prior to visit.    ALLERGIES: Allergies  Allergen Reactions  . Citalopram     Unknown   . Nabumetone Other (See Comments)    extrimities fall asleep  . Nitrofurantoin Monohyd Macro     Unknown   . Tizanidine Other (See Comments)    Severe nose bleeds    FAMILY HISTORY: No family history on file.  SOCIAL HISTORY: History   Social History  . Marital Status: Single    Spouse Name: N/A    Number of Children: N/A  . Years of Education: N/A   Occupational History  . Not on file.   Social History Main Topics  . Smoking status: Never Smoker   . Smokeless tobacco: Not on file  . Alcohol Use: No  . Drug Use: No  . Sexual Activity: No   Other Topics  Concern  . Not on file   Social History Narrative  . No narrative on file    REVIEW OF SYSTEMS: Constitutional: No fevers, chills, or sweats, no generalized fatigue, change in appetite Eyes: No visual changes, double vision, eye pain Ear, nose and throat: No hearing loss, ear pain, nasal congestion, sore throat Cardiovascular: No chest pain, palpitations Respiratory:  No shortness of breath at rest or with exertion, wheezes GastrointestinaI: No nausea, vomiting, diarrhea, abdominal pain, +fecal incontinence Genitourinary:  No dysuria, + urinary retention Musculoskeletal:  + neck pain, back pain Integumentary: No rash, pruritus, skin lesions Neurological: as above Psychiatric: No depression, insomnia, anxiety Endocrine: No palpitations, fatigue, diaphoresis, mood swings, change in appetite, change in weight, increased thirst Hematologic/Lymphatic:  No anemia, purpura, petechiae. Allergic/Immunologic: no itchy/runny eyes, nasal congestion, recent allergic reactions, rashes  PHYSICAL EXAM: Filed Vitals:   07/02/14 0913  BP: 130/90  Pulse: 95  Resp: 18   General: No acute distress, sitting comfortably on wheelchair Head:  Normocephalic/atraumatic Eyes: Fundoscopic exam shows bilateral sharp discs, no vessel changes, exudates, or hemorrhages Neck: supple, no paraspinal tenderness, full range of motion Back: No paraspinal tenderness Heart: regular rate and rhythm Lungs: Clear to auscultation bilaterally. Vascular: No carotid bruits. Skin/Extremities: No rash, no edema Neurological Exam: Mental status: alert and oriented to person, place, and time, no dysarthria or aphasia, Fund of knowledge is appropriate.  Recent and remote memory are intact.  Attention and concentration are normal.    Able to name objects and repeat phrases. Cranial nerves: CN I: not tested CN II: pupils equal, round and reactive to light, visual fields intact, fundi unremarkable. CN III, IV, VI:  full range  of motion, no nystagmus, no ptosis CN V: facial sensation intact CN VII: upper and lower face symmetric CN VIII: hearing intact to finger rub CN IX, X: gag intact, uvula midline CN XI: sternocleidomastoid and trapezius muscles intact CN XII: tongue midline Bulk & Tone: tone is increased in both LE, no fasciculations. Motor: he was able to give 5/5 strength throughout with encouragement, no pronator drift. Sensation: intact to light touch, cold, pin on both UE.  He reported that he could not feel anything at all in both LE, however when asked to say yes/no with eyes closed if he was feeling anything, he would say "no" each time tuning fork or pin is applied to his legs without examiner prompting a response.  Deep Tendon Reflexes: +2 on both UE, brisk +3 on both LE with no ankle clonus, negative Hoffman sign Plantar responses: downgoing bilaterally Cerebellar: no incoordination on finger to nose, heel to  shin. No dysdiadochokinesia Gait: He was able to take a few steps without assistance, slow and cautious Tremor: none   IMPRESSION: This is a 23 year old left-handed man with a history of neurogenic bladder, bilateral lower extremity spasticity of unclear etiology, presenting for another neurological opinion on his condition. He has seen multiple specialists at [place], [facility], and [facility] and has undergone extensive evaluation.  He reports loss of sensation in both legs last week, however on exam today there is a functional component with sensory testing as noted above. We discussed his concerns and questions regarding the MRI thoracic and lumbar results, I clarified to him that there is no spinal cord abnormality seen, the abnormalities noted are due to disc and facet changes, which would not explain his symptoms.  He is frustrated with the back and forth on the baclofen pump decision at [facility], and I have discussed with him that this is something he should clarify with them.  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 He expressed understanding and decided to continue follow-up at [facility].    Thank you for allowing me to participate in the care of this patient. Please do not hesitate to call for any questions or concerns.   Ellouise Newer, M.D.  CC: Dr. Franne Forts

## 2014-07-02 NOTE — Patient Instructions (Signed)
1. [facility] 2. Discuss baclofen pump questions with [facility]

## 2014-07-08 ENCOUNTER — Encounter: Payer: Self-pay | Admitting: Neurology

## 2014-07-08 DIAGNOSIS — M62838 Other muscle spasm: Secondary | ICD-10-CM | POA: Insufficient documentation

## 2014-07-08 DIAGNOSIS — N319 Neuromuscular dysfunction of bladder, unspecified: Secondary | ICD-10-CM | POA: Insufficient documentation

## 2014-07-08 DIAGNOSIS — F447 Conversion disorder with mixed symptom presentation: Secondary | ICD-10-CM | POA: Insufficient documentation

## 2014-07-15 ENCOUNTER — Ambulatory Visit: Payer: BC Managed Care – PPO | Admitting: Neurology

## 2014-12-24 ENCOUNTER — Emergency Department (HOSPITAL_COMMUNITY)
Admission: EM | Admit: 2014-12-24 | Discharge: 2014-12-24 | Disposition: A | Discharge status: Home / Self Care | Payer: BC Managed Care – PPO | Attending: Emergency Medicine | Admitting: Emergency Medicine

## 2014-12-24 ENCOUNTER — Encounter (HOSPITAL_COMMUNITY): Payer: Self-pay | Admitting: Nurse Practitioner

## 2014-12-24 DIAGNOSIS — R339 Retention of urine, unspecified: Secondary | ICD-10-CM | POA: Diagnosis present

## 2014-12-24 DIAGNOSIS — Z87448 Personal history of other diseases of urinary system: Secondary | ICD-10-CM | POA: Diagnosis not present

## 2014-12-24 DIAGNOSIS — Z79899 Other long term (current) drug therapy: Secondary | ICD-10-CM | POA: Insufficient documentation

## 2014-12-24 LAB — URINALYSIS, ROUTINE W REFLEX MICROSCOPIC
Bilirubin Urine: NEGATIVE
GLUCOSE, UA: NEGATIVE mg/dL
HGB URINE DIPSTICK: NEGATIVE
Ketones, ur: NEGATIVE mg/dL
Leukocytes, UA: NEGATIVE
Nitrite: NEGATIVE
Protein, ur: NEGATIVE mg/dL
SPECIFIC GRAVITY, URINE: 1.016 (ref 1.005–1.030)
Urobilinogen, UA: 1 mg/dL (ref 0.0–1.0)
pH: 6 (ref 5.0–8.0)

## 2014-12-24 NOTE — ED Notes (Signed)
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 His urologist advised pt to come in and have a foley put in.

## 2014-12-24 NOTE — Discharge Instructions (Signed)
You will need to have the Foley catheter in, until you follow-up with your urologist.    Acute Urinary Retention Acute urinary retention is the temporary inability to urinate. This is a common problem in older men. As men age their prostates become larger and block the flow of urine from the bladder. This is usually a problem that has come on gradually.  HOME CARE INSTRUCTIONS If you are sent home with a Foley catheter and a drainage system, you will need to discuss the best course of action with your health care provider. While the catheter is in, maintain a good intake of fluids. Keep the drainage bag emptied and lower than your catheter. This is so that contaminated urine will not flow back into your bladder, which could lead to a urinary tract infection. There are two main types of drainage bags. One is a large bag that usually is used at night. It has a good capacity that will allow you to sleep through the night without having to empty it. The second type is called a leg bag. It has a smaller capacity, so it needs to be emptied more frequently. However, the main advantage is that it can be attached by a leg strap and can go underneath your clothing, allowing you the freedom to move about or leave your home. Only take over-the-counter or prescription medicines for pain, discomfort, or fever as directed by your health care provider.  SEEK MEDICAL CARE IF:  You develop a low-grade fever.  You experience spasms or leakage of urine with the spasms. SEEK IMMEDIATE MEDICAL CARE IF:   You develop chills or fever.  Your catheter stops draining urine.  Your catheter falls out.  You start to develop increased bleeding that does not respond to rest and increased fluid intake. MAKE SURE YOU:  Understand these instructions.  Will watch your condition.  Will get help right away if you are not doing well or get worse. Document Released: 12/18/2000 Document Revised: 09/16/2013 Document Reviewed:  02/20/2013 [facility] Patient Information 2015 [employer] [place]. This information is not intended to replace advice given to you by your health care provider. Make sure you discuss any questions you have with your health care provider.

## 2014-12-24 NOTE — ED Provider Notes (Signed)
CSN: [id number]     Arrival date & time 12/24/14  1655 History   First MD Initiated Contact with Patient 12/24/14 1738     Chief Complaint  Patient presents with  . Urinary Retention     (Consider location/radiation/quality/duration/timing/severity/associated sxs/prior Treatment) The history is provided by the patient.    Juan Blake is a 24 y.o. male who usually is able to self catheterize himself, but has not been able to do it at all today. He feels like his "spasticity" is causing him to be unable to catheterize himself. He denies fever, chills, nausea, vomiting, change in his ability to move, or pain. He has a T10 paraplegia, with decreased sensation below his chest. He is able to move by transferring only to his wheelchair. He cannot walk. He gets most of his care in [place], [place]. There are no other known modifying factors.   Past Medical History  Diagnosis Date  . Testicular torsion   . Neurogenic bladder disorder    Past Surgical History  Procedure Laterality Date  . Testicle surgery    . Foot surgery    . Knee surgery    . Neck surgery    . Tympanostomy tube placement    . Hip surgery    . Posterior fusion cervical spine    . Calcaneal osteotomy w/ internal fixation    . Gastrocnemius recession    . Osteotomy tarsal     History reviewed. No pertinent family history. History  Substance Use Topics  . Smoking status: Never Smoker   . Smokeless tobacco: Not on file  . Alcohol Use: No    Review of Systems  All other systems reviewed and are negative.     Allergies  Citalopram; Nabumetone; Nitrofurantoin monohyd macro; and Tizanidine  Home Medications   Prior to Admission medications   Medication Sig Start Date End Date Taking? Authorizing Provider  baclofen (LIORESAL) 20 MG tablet Take 20 mg by mouth 4 (four) times daily.   Yes Historical Provider, MD  glycerin adult 2 G SUPP Place 1 suppository rectally 2 (two) times daily.   Yes  Historical Provider, MD  polyethylene glycol (MIRALAX / GLYCOLAX) packet Take 17 g by mouth daily.   Yes Historical Provider, MD  HYDROcodone-acetaminophen (NORCO/VICODIN) 5-325 MG per tablet Take 1 tablet by mouth every 4 (four) hours as needed for moderate pain.    Historical Provider, MD   BP 136/92 mmHg  Pulse 87  Temp(Src) 98.1 F (36.7 C)  Resp 16  SpO2 98% Physical Exam  Constitutional: He is oriented to person, place, and time. He appears well-developed and well-nourished. No distress.  HENT:  Head: Normocephalic and atraumatic.  Right Ear: External ear normal.  Left Ear: External ear normal.  Eyes: Conjunctivae and EOM are normal. Pupils are equal, round, and reactive to light.  Neck: Normal range of motion and phonation normal. Neck supple.  Cardiovascular: Normal rate.   Pulmonary/Chest: Effort normal. He exhibits no bony tenderness.  Musculoskeletal: Normal range of motion.  Neurological: He is alert and oriented to person, place, and time. No cranial nerve deficit or sensory deficit. He exhibits normal muscle tone. Coordination normal.  Skin: Skin is warm, dry and intact.  Psychiatric: He has a normal mood and affect. His behavior is normal. Judgment and thought content normal.  Nursing note and vitals reviewed.   ED Course  Procedures (including critical care time)   Urinary retention present on bladder scan  Foley placed by nurse,  without complication  Findings and plan discussed with patient, all questions answered.   Labs Review Labs Reviewed  URINALYSIS, ROUTINE W REFLEX MICROSCOPIC    Imaging Review No results found.   EKG Interpretation None      MDM   Final diagnoses:  Urinary retention    Urinary retention, without clear cause. No evidence for UTI. Patient chronically self catheterizes. 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  Nursing Notes Reviewed/ Care Coordinated Applicable Imaging Reviewed Interpretation of  Laboratory Data incorporated into ED treatment  The patient appears reasonably screened and/or stabilized for discharge and I doubt any other medical condition or other [facility] requiring further screening, evaluation, or treatment in the ED at this time prior to discharge.  Plan: Home Medications- usual; Home Treatments- rest; return here if the recommended treatment, does not improve the symptoms; Recommended follow up- Urology 3-5 days     Mancel BaleElliott Joci Dress, MD 12/24/14 2354

## 2014-12-26 ENCOUNTER — Encounter (HOSPITAL_COMMUNITY): Payer: Self-pay | Admitting: *Deleted

## 2014-12-26 ENCOUNTER — Emergency Department (HOSPITAL_COMMUNITY)
Admission: EM | Admit: 2014-12-26 | Discharge: 2014-12-26 | Disposition: A | Discharge status: Home / Self Care | Payer: BC Managed Care – PPO | Attending: Emergency Medicine | Admitting: Emergency Medicine

## 2014-12-26 DIAGNOSIS — T83098A Other mechanical complication of other indwelling urethral catheter, initial encounter: Secondary | ICD-10-CM | POA: Diagnosis present

## 2014-12-26 DIAGNOSIS — Z87448 Personal history of other diseases of urinary system: Secondary | ICD-10-CM | POA: Diagnosis not present

## 2014-12-26 DIAGNOSIS — Y846 Urinary catheterization as the cause of abnormal reaction of the patient, or of later complication, without mention of misadventure at the time of the procedure: Secondary | ICD-10-CM | POA: Insufficient documentation

## 2014-12-26 DIAGNOSIS — Z79899 Other long term (current) drug therapy: Secondary | ICD-10-CM | POA: Insufficient documentation

## 2014-12-26 DIAGNOSIS — T839XXA Unspecified complication of genitourinary prosthetic device, implant and graft, initial encounter: Secondary | ICD-10-CM

## 2014-12-26 NOTE — ED Notes (Signed)
Pt states his foley catheter came out this morning at 8. Pt states the catheter came out on his on it's own, states there was "a lot of blood" when it came out. Pt has hx of T10 spinal injury. Pt states he has had yellow vision since 12pm this morning. Pt states he is only taking baclofen and suppositories to help with bowel movements.

## 2014-12-26 NOTE — ED Notes (Signed)
[facility] d[facility]t his primary care has been there.

## 2014-12-26 NOTE — ED Provider Notes (Addendum)
CSN: [id number]     Arrival date & time 12/26/14  1345 History   First MD Initiated Contact with Patient 12/26/14 1400     Chief Complaint  Patient presents with  . Foley fell out      (Consider location/radiation/quality/duration/timing/severity/associated sxs/prior Treatment) HPI Comments: Patient with long-standing neurogenic bladder and asked to see in his legs of unknown origin who has been seen by multiple specialists with the concern for possible conversion disorder who presents today because his Foley catheter came out. Patient states his bladder spasms so strongly that it pushes the Foley catheter out causing it balloon to break in the catheter to come out. Patient prior was in and out cathing however has required a Foley for the last 3 days. He has had 3 Foley catheters placed in the last 3 days because he states they continue to break him falling out.  Patient is a 24 y.o. male presenting with hematuria. The history is provided by the patient.  Hematuria This is a new problem. Episode onset: 8 am when foley came out. The problem occurs constantly. The problem has been resolved. Associated symptoms comments: Unable to urinate and severe bladder spasm. Nothing aggravates the symptoms. Nothing relieves the symptoms. He has tried nothing for the symptoms. The treatment provided no relief.    Past Medical History  Diagnosis Date  . Testicular torsion   . Neurogenic bladder disorder    Past Surgical History  Procedure Laterality Date  . Testicle surgery    . Foot surgery    . Knee surgery    . Neck surgery    . Tympanostomy tube placement    . Hip surgery    . Posterior fusion cervical spine    . Calcaneal osteotomy w/ internal fixation    . Gastrocnemius recession    . Osteotomy tarsal     No family history on file. History  Substance Use Topics  . Smoking status: Never Smoker   . Smokeless tobacco: Not on file  . Alcohol Use: No    Review of Systems  Genitourinary:  Positive for hematuria.  All other systems reviewed and are negative.     Allergies  Citalopram; Nabumetone; Nitrofurantoin monohyd macro; and Tizanidine  Home Medications   Prior to Admission medications   Medication Sig Start Date End Date Taking? Authorizing Provider  baclofen (LIORESAL) 20 MG tablet Take 20 mg by mouth 4 (four) times daily.   Yes Historical Provider, MD  glycerin adult 2 G SUPP Place 1 suppository rectally 2 (two) times daily.   Yes Historical Provider, MD  HYDROcodone-acetaminophen (NORCO/VICODIN) 5-325 MG per tablet Take 1 tablet by mouth every 4 (four) hours as needed for moderate pain.   Yes Historical Provider, MD  polyethylene glycol (MIRALAX / GLYCOLAX) packet Take 17 g by mouth daily.   Yes Historical Provider, MD   BP 141/90 mmHg  Pulse 90  Temp(Src) 98.1 F (36.7 C)  SpO2 96% Physical Exam  Constitutional: He is oriented to person, place, and time. He appears well-developed and well-nourished. No distress.  HENT:  Head: Normocephalic and atraumatic.  Mouth/Throat: Oropharynx is clear and moist.  Eyes: Conjunctivae and EOM are normal. Pupils are equal, round, and reactive to light.  Neck: Normal range of motion. Neck supple.  Cardiovascular: Normal rate.   Pulmonary/Chest: Effort normal.  Abdominal: Soft. There is no tenderness.  No sensation in his lower abdomen or lower legs.  Musculoskeletal: Normal range of motion. He exhibits no edema or tenderness.  Neurological: He is alert and oriented to person, place, and time.  Spasticity of bilateral lower ext  Skin: Skin is warm and dry. No rash noted. No erythema.  Psychiatric: He has a normal mood and affect. His behavior is normal.  Nursing note and vitals reviewed.   ED Course  Procedures (including critical care time) Labs Review Labs Reviewed - No data to display  Imaging Review No results found.   EKG Interpretation None      MDM   Final diagnoses:  Foley catheter problem,  initial encounter   patient with a long history of neurogenic bladder who in and out self caths from unknown pathology. He seen multiple neurologists, urologist and other specialist at [facility] and here without cause for his symptoms. He states this morning he [facility] and because of bladder spasm it pushed the catheter out. Since that time he has not urinated. He states the catheter came out at 8 AM this morning and he has had no urine drainage since. Here patient only has 200 mL's of urine in his bladder without significant signs of retention. I informed him that we would only be able to place a Foley here but no other intervention at this time would be done. He is opting to [facility] as his urologist is on call today.  At this point do not feel that patient needs a Foley catheter. He is still taking baclofen and had a urinalysis done 3 days ago without signs of infection. He is otherwise well-appearing with normal vital signs.    Gwyneth SproutWhitney Jorma Tassinari, MD 12/26/14 1500  Gwyneth SproutWhitney Denae Zulueta, MD 12/26/14 [phone]

## 2015-01-13 ENCOUNTER — Encounter (HOSPITAL_COMMUNITY): Payer: Self-pay | Admitting: Emergency Medicine

## 2015-01-13 ENCOUNTER — Emergency Department (HOSPITAL_COMMUNITY)
Admission: EM | Admit: 2015-01-13 | Discharge: 2015-01-13 | Disposition: A | Discharge status: Home / Self Care | Payer: BC Managed Care – PPO | Attending: Emergency Medicine | Admitting: Emergency Medicine

## 2015-01-13 DIAGNOSIS — F4310 Post-traumatic stress disorder, unspecified: Secondary | ICD-10-CM | POA: Insufficient documentation

## 2015-01-13 DIAGNOSIS — R45851 Suicidal ideations: Secondary | ICD-10-CM | POA: Diagnosis present

## 2015-01-13 DIAGNOSIS — Z79899 Other long term (current) drug therapy: Secondary | ICD-10-CM | POA: Insufficient documentation

## 2015-01-13 DIAGNOSIS — Z87448 Personal history of other diseases of urinary system: Secondary | ICD-10-CM | POA: Insufficient documentation

## 2015-01-13 HISTORY — DX: Post-traumatic stress disorder, unspecified: F43.10

## 2015-01-13 LAB — COMPREHENSIVE METABOLIC PANEL
ALK PHOS: 87 U/L (ref 39–117)
ALT: 70 U/L — AB (ref 0–53)
ANION GAP: 11 (ref 5–15)
AST: 44 U/L — AB (ref 0–37)
Albumin: 4.9 g/dL (ref 3.5–5.2)
BILIRUBIN TOTAL: 0.3 mg/dL (ref 0.3–1.2)
BUN: 10 mg/dL (ref 6–23)
CHLORIDE: 103 mmol/L (ref 96–112)
CO2: 26 mmol/L (ref 19–32)
Calcium: 9.4 mg/dL (ref 8.4–10.5)
Creatinine, Ser: 0.57 mg/dL (ref 0.50–1.35)
GFR calc Af Amer: >90 mL/min (ref >90)
GFR calc non Af Amer: >90 mL/min (ref >90)
Glucose, Bld: 108 mg/dL — ABNORMAL HIGH (ref 70–99)
Potassium: 3.9 mmol/L (ref 3.5–5.1)
SODIUM: 140 mmol/L (ref 135–145)
TOTAL PROTEIN: 8.9 g/dL — AB (ref 6.0–8.3)

## 2015-01-13 LAB — RAPID URINE DRUG SCREEN, HOSP PERFORMED
Amphetamines: NOT DETECTED
Barbiturates: NOT DETECTED
Benzodiazepines: NOT DETECTED
Cocaine: NOT DETECTED
Opiates: NOT DETECTED
Tetrahydrocannabinol: NOT DETECTED

## 2015-01-13 LAB — CBC
HCT: 44.9 % (ref 39.0–52.0)
Hemoglobin: 15.5 g/dL (ref 13.0–17.0)
MCH: 29.4 pg (ref 26.0–34.0)
MCHC: 34.5 g/dL (ref 30.0–36.0)
MCV: 85.2 fL (ref 78.0–100.0)
Platelets: 194 K/uL (ref 150–400)
RBC: 5.27 MIL/uL (ref 4.22–5.81)
RDW: 12.5 % (ref 11.5–15.5)
WBC: 8.2 K/uL (ref 4.0–10.5)

## 2015-01-13 LAB — ETHANOL

## 2015-01-13 LAB — SALICYLATE LEVEL: Salicylate Lvl: <4 mg/dL (ref 2.8–20.0)

## 2015-01-13 LAB — ACETAMINOPHEN LEVEL

## 2015-01-13 MED ORDER — ACETAMINOPHEN 325 MG PO TABS: 650.0000 mg | ORAL_TABLET | ORAL | Status: DC | PRN

## 2015-01-13 MED ORDER — ZOLPIDEM TARTRATE 5 MG PO TABS: 5.0000 mg | ORAL_TABLET | Freq: Every evening | ORAL | Status: DC | PRN

## 2015-01-13 NOTE — ED Notes (Signed)
MD at bedside. [place] PRESENT

## 2015-01-13 NOTE — ED Notes (Signed)
Bed: [facility] Expected date:  Expected time:  Means of arrival:  Comments: 

## 2015-01-13 NOTE — ED Provider Notes (Signed)
CSN: [id number]     Arrival date & time 01/13/15  1242 History   First MD Initiated Contact with Patient 01/13/15 1503     Chief Complaint  Patient presents with  . Insomnia  . PTSD   . Suicidal     (Consider location/radiation/quality/duration/timing/severity/associated sxs/prior Treatment) HPI  24 year old male presents with worsening night terrors, seeing shadows of family at night, as well as suicidal thoughts. States he's not suicidal at this moment, but wrote a suicide note with plan 2 weeks ago and posted on facebook. History of PTSD and is currently in counseling where he is reliving a lot of the issues he's had. Also having insomnia. No homicidal thoughts. Has been depressed and having increasing anger. Counselor advised him to come to ER for psych consult. No other complaints, including no fevers, vomiting or pain. No concern for UTI by family/patient (has prior history and self-cath's.)  Past Medical History  Diagnosis Date  . Testicular torsion   . Neurogenic bladder disorder   . PTSD (post-traumatic stress disorder)    Past Surgical History  Procedure Laterality Date  . Testicle surgery    . Foot surgery    . Knee surgery    . Neck surgery    . Tympanostomy tube placement    . Hip surgery    . Posterior fusion cervical spine    . Calcaneal osteotomy w/ internal fixation    . Gastrocnemius recession    . Osteotomy tarsal     No family history on file. History  Substance Use Topics  . Smoking status: Never Smoker   . Smokeless tobacco: Not on file  . Alcohol Use: No    Review of Systems  Constitutional: Negative for fever.  Gastrointestinal: Negative for vomiting.  Psychiatric/Behavioral: Positive for sleep disturbance, dysphoric mood and agitation. Negative for self-injury.  All other systems reviewed and are negative.     Allergies  Citalopram; Nabumetone; Nitrofurantoin monohyd macro; and Tizanidine  Home Medications   Prior to Admission  medications   Medication Sig Start Date End Date Taking? Authorizing Provider  baclofen (LIORESAL) 20 MG tablet Take 20 mg by mouth 4 (four) times daily.    Historical Provider, MD  glycerin adult 2 G SUPP Place 1 suppository rectally 2 (two) times daily.    Historical Provider, MD  HYDROcodone-acetaminophen (NORCO/VICODIN) 5-325 MG per tablet Take 1 tablet by mouth every 4 (four) hours as needed for moderate pain.    Historical Provider, MD  polyethylene glycol (MIRALAX / GLYCOLAX) packet Take 17 g by mouth daily.    Historical Provider, MD   There were no vitals taken for this visit. Physical Exam  Constitutional: He is oriented to person, place, and time. He appears well-developed and well-nourished.  HENT:  Head: Normocephalic and atraumatic.  Right Ear: External ear normal.  Left Ear: External ear normal.  Nose: Nose normal.  Eyes: Right eye exhibits no discharge. Left eye exhibits no discharge.  Neck: Neck supple.  Cardiovascular: Normal rate, regular rhythm, normal heart sounds and intact distal pulses.   Pulmonary/Chest: Effort normal and breath sounds normal.  Abdominal: Soft. He exhibits no distension. There is no tenderness.  Musculoskeletal: He exhibits no edema.  Neurological: He is alert and oriented to person, place, and time.  Skin: Skin is warm and dry.  Psychiatric: His speech is normal and behavior is normal. His mood appears not anxious. His affect is not angry. He is not agitated. He does not exhibit a depressed  mood. He expresses no homicidal and no suicidal ideation.  Nursing note and vitals reviewed.   ED Course  Procedures (including critical care time) Labs Review Labs Reviewed  COMPREHENSIVE METABOLIC PANEL - Abnormal; Notable for the following:    Glucose, Bld 108 (*)    Total Protein 8.9 (*)    AST 44 (*)    ALT 70 (*)    All other components within normal limits  ACETAMINOPHEN LEVEL - Abnormal; Notable for the following:    Acetaminophen (Tylenol),  Serum <10.0 (*)    All other components within normal limits  CBC  URINE RAPID DRUG SCREEN (HOSP PERFORMED)  ETHANOL  SALICYLATE LEVEL    Imaging Review No results found.   EKG Interpretation None      MDM   Final diagnoses:  PTSD (post-traumatic stress disorder)    Patient medically clear. Psych has seen and recommends f/u with his counselor/psych as outpatient given no acute SI/HI. He and family are comfortable with this plan. He denies SI at this time and is seeing his counselor tomorrow.    Pricilla LovelessScott Kanika Bungert, MD 01/14/15 [phone]
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
## 2015-01-13 NOTE — Discharge Instructions (Signed)
Posttraumatic Stress Disorder Posttraumatic stress disorder (PTSD) is a mental disorder. It occurs after a traumatic event in your life. The traumatic events that cause PTSD are outside the range of normal human experience. Examples of these events include war, automobile accidents, natural disasters, rape, domestic violence, and violent crimes. Most people who experience these types of events are able to heal on their own. Those who do not heal develop PTSD. PTSD can happen to anyone at any age. However, people with a history of childhood abuse are at increased risk for developing PTSD.  SYMPTOMS  The traumatic event that causes PTSD must be a threat to life, cause serious injury, or involve sexual violence. The traumatic event is usually experienced directly by the person who develops PTSD. Sometimes PTSD occurs in people who witness traumas that occur to others or who hear about a trauma that occurs to a close family member or friend. The following behaviors are characteristic of people with PTSD: 1. People with PTSD re-experience the traumatic event in one or more of the following ways (intrusion symptoms): 1. Recurrent, unwanted distressing memories while awake. 2. Recurrent distressing dreams. 3. Sensations similar to those felt when the event originally occurred (flashbacks).  4. Intense or prolonged emotional distress, triggered by reminders of the trauma. This may include fear, horror, intense sadness, or anger. 5. Marked physical reactions, triggered by reminders of the trauma. This may include racing heart, shortness of breath, sweating, and shaking. 2. People with PTSD avoid thoughts, conversations, people, or activities that remind them of the traumatic event (avoidance symptoms). 3. People with PTSD have negative changes in their thinking and mood after the traumatic event. These changes include: 1. Inability to remember one or more significant aspects of the traumatic event (memory  gaps). 2. Exaggerated negative perceptions about themselves or others, such as believing that they are bad people or that no one can be trusted. 3. Unrealistic assignment of blame to themselves or others for the traumatic event. 4. Persistent negative emotional state, such as fear, horror, anger, sadness, guilt, or shame. 5. Markedly decreased interest or participation in significant activities. 6. A loss of connection with other people. 7. Inability to experience positive emotions, such as happiness or love. 4. People with PTSD are more sensitive to their environment and react more easily than others (hyperarousal-overreactivity symptoms). These symptoms include: 1. Irritability, with angry outbursts toward other people or objects. The outbursts are easily triggered and may be verbal or physical. 2. Careless or self-destructive behavior. This may include reckless driving or drug use. 3. A feeling of being on edge, with increased alertness (hypervigilance). 4. Exaggerated reactions to stimuli, such as being easily startled.  5. Difficulty concentrating. 6. Difficulty sleeping. PTSD symptoms may start soon after a frightening event or months or years later. They last at least 1 month or longer and can affect one or more areas of functioning, such as social or occupational functioning.  DIAGNOSIS  PTSD is diagnosed through an assessment by a mental health professional. Bonita Quin will be asked questions about the traumatic events in your life. You will also be asked about how these events have changed your thoughts, mood, behavior, and ability to function on a daily basis. You may be asked about your use of alcohol or drugs, which can make PTSD symptoms worse. TREATMENT  Unlike many mental disorders, which require lifelong management, PTSD is a curable condition. The goal of PTSD treatment is to neutralize the negative effects of the traumatic event on daily  functioning, not erase the memory of the  event. The following treatments may be prescribed to reach this goal:  Medicines. Certain medicines can reduce some PTSD symptoms. Intrusion symptoms and hyperarousal-overactivity symptoms respond best to medicines.  Counseling (talk therapy). Talk therapy with a mental health professional who is experienced in treating PTSD can help. Talk therapy can provide education, emotional support, and coping skills. Certain types of talk therapy that specifically target the traumatic events are the most effective treatment for PTSD:  Prolonged exposure therapy, which involves remembering and processing the traumatic event with a therapist in a safe environment until it no longer creates a negative emotional response.  Eye movement desensitization and reprocessing therapy, which involves the use of repetitive physical stimulation of the senses that alternates between the right and left sides of the body. It is believed that this therapy facilitates communication between the two sides of the brain. This communication helps the mind to integrate the fragmented memories of the traumatic event into a whole story that makes sense and no longer creates a negative emotional response. Most people with PTSD benefit from a combination of these treatments.  Document Released: 06/06/2001 Document Revised: 01/26/2014 Document Reviewed: 11/28/2012 [facility] Patient Information 2015 [place]. This information is not intended to replace advice given to you by your health care provider. Make sure you discuss any questions you have with your health care provider.    Depression Depression refers to feeling sad, low, down in the dumps, blue, gloomy, or empty. In general, there are two kinds of depression: 5. Normal sadness or normal grief. This kind of depression is one that we all feel from time to time after upsetting life experiences, such as the loss of a job or the ending of a relationship. This kind of depression is  considered normal, is short lived, and resolves within a few days to 2 weeks. Depression experienced after the loss of a loved one (bereavement) often lasts longer than 2 weeks but normally gets better with time. 6. Clinical depression. This kind of depression lasts longer than normal sadness or normal grief or interferes with your ability to function at home, at work, and in school. It also interferes with your personal relationships. It affects almost every aspect of your life. Clinical depression is an illness. Symptoms of depression can also be caused by conditions other than those mentioned above, such as:  Physical illness. Some physical illnesses, including underactive thyroid gland (hypothyroidism), severe anemia, specific types of cancer, diabetes, uncontrolled seizures, heart and lung problems, strokes, and chronic pain are commonly associated with symptoms of depression.  Side effects of some prescription medicine. In some people, certain types of medicine can cause symptoms of depression.  Substance abuse. Abuse of alcohol and illicit drugs can cause symptoms of depression. SYMPTOMS Symptoms of normal sadness and normal grief include the following:  Feeling sad or crying for short periods of time.  Not caring about anything (apathy).  Difficulty sleeping or sleeping too much.  No longer able to enjoy the things you used to enjoy.  Desire to be by oneself all the time (social isolation).  Lack of energy or motivation.  Difficulty concentrating or remembering.  Change in appetite or weight.  Restlessness or agitation. Symptoms of clinical depression include the same symptoms of normal sadness or normal grief and also the following symptoms:  Feeling sad or crying all the time.  Feelings of guilt or worthlessness.  Feelings of hopelessness or helplessness.  Thoughts of suicide  or the desire to harm yourself (suicidal ideation).  Loss of touch with reality (psychotic  symptoms). Seeing or hearing things that are not real (hallucinations) or having false beliefs about your life or the people around you (delusions and paranoia). DIAGNOSIS  The diagnosis of clinical depression is usually based on how bad the symptoms are and how long they have lasted. Your health care provider will also ask you questions about your medical history and substance use to find out if physical illness, use of prescription medicine, or substance abuse is causing your depression. Your health care provider may also order blood tests. TREATMENT  Often, normal sadness and normal grief do not require treatment. However, sometimes antidepressant medicine is given for bereavement to ease the depressive symptoms until they resolve. The treatment for clinical depression depends on how bad the symptoms are but often includes antidepressant medicine, counseling with a mental health professional, or both. Your health care provider will help to determine what treatment is best for you. Depression caused by physical illness usually goes away with appropriate medical treatment of the illness. If prescription medicine is causing depression, talk with your health care provider about stopping the medicine, decreasing the dose, or changing to another medicine. Depression caused by the abuse of alcohol or illicit drugs goes away when you stop using these substances. Some adults need professional help in order to stop drinking or using drugs. SEEK IMMEDIATE MEDICAL CARE IF:  You have thoughts about hurting yourself or others.  You lose touch with reality (have psychotic symptoms).  You are taking medicine for depression and have a serious side effect. FOR MORE INFORMATION  National Alliance on Mental Illness: www.nami.[employer] of Mental Health: http://www.maynard.net/ Document Released: 09/08/2000 Document Revised: 01/26/2014 Document Reviewed: 12/11/2011 [facility] Patient Information 2015  [place], [place]. This information is not intended to replace advice given to you by your health care provider. Make sure you discuss any questions you have with your health care provider.

## 2015-01-13 NOTE — ED Notes (Signed)
Per pt, states he has a history of PTSD from childhood-having increased night terrors, and flashbacks-counselor recommended he come here for psyche eval

## 2015-01-28 ENCOUNTER — Emergency Department (HOSPITAL_COMMUNITY): Payer: BC Managed Care – PPO

## 2015-01-28 ENCOUNTER — Encounter (HOSPITAL_COMMUNITY): Payer: Self-pay | Admitting: Emergency Medicine

## 2015-01-28 ENCOUNTER — Emergency Department (HOSPITAL_COMMUNITY)
Admission: EM | Admit: 2015-01-28 | Discharge: 2015-01-29 | Disposition: A | Discharge status: Home / Self Care | Payer: BC Managed Care – PPO | Attending: Emergency Medicine | Admitting: Emergency Medicine

## 2015-01-28 DIAGNOSIS — W1839XA Other fall on same level, initial encounter: Secondary | ICD-10-CM | POA: Diagnosis not present

## 2015-01-28 DIAGNOSIS — N319 Neuromuscular dysfunction of bladder, unspecified: Secondary | ICD-10-CM | POA: Insufficient documentation

## 2015-01-28 DIAGNOSIS — G588 Other specified mononeuropathies: Secondary | ICD-10-CM | POA: Diagnosis not present

## 2015-01-28 DIAGNOSIS — Z8659 Personal history of other mental and behavioral disorders: Secondary | ICD-10-CM | POA: Insufficient documentation

## 2015-01-28 DIAGNOSIS — Y9389 Activity, other specified: Secondary | ICD-10-CM | POA: Insufficient documentation

## 2015-01-28 DIAGNOSIS — S0990XA Unspecified injury of head, initial encounter: Secondary | ICD-10-CM | POA: Insufficient documentation

## 2015-01-28 DIAGNOSIS — S199XXA Unspecified injury of neck, initial encounter: Secondary | ICD-10-CM | POA: Diagnosis not present

## 2015-01-28 DIAGNOSIS — Y92091 Bathroom in other non-institutional residence as the place of occurrence of the external cause: Secondary | ICD-10-CM | POA: Diagnosis not present

## 2015-01-28 DIAGNOSIS — Y998 Other external cause status: Secondary | ICD-10-CM | POA: Insufficient documentation

## 2015-01-28 DIAGNOSIS — S4992XA Unspecified injury of left shoulder and upper arm, initial encounter: Secondary | ICD-10-CM | POA: Diagnosis not present

## 2015-01-28 DIAGNOSIS — S4991XA Unspecified injury of right shoulder and upper arm, initial encounter: Secondary | ICD-10-CM | POA: Diagnosis present

## 2015-01-28 DIAGNOSIS — Z79899 Other long term (current) drug therapy: Secondary | ICD-10-CM | POA: Diagnosis not present

## 2015-01-28 DIAGNOSIS — R202 Paresthesia of skin: Secondary | ICD-10-CM

## 2015-01-28 DIAGNOSIS — W19XXXA Unspecified fall, initial encounter: Secondary | ICD-10-CM

## 2015-01-28 HISTORY — DX: Concussion with loss of consciousness status unknown, initial encounter: S06.0XAA

## 2015-01-28 HISTORY — DX: Concussion with loss of consciousness of unspecified duration, initial encounter: S06.0X9A

## 2015-01-28 NOTE — ED Notes (Signed)
Per pt's friend, pt fell face first on the floor of the bathroom. He states that the pt woke up, and was able to get back to where he was sitting, at which point the pt's friend states that the pt was going in and out of consciousness.

## 2015-01-28 NOTE — ED Notes (Signed)
[place] from his wheel chair to a toilet. [facility] but does [facility]. Pt is normally wheel chair bound, and has a hx of previous spinal injury, which has left him with minimal movement in his legs, no sensation in the legs, and mild tingling in his arms. Pt states that the tingling in his arms is worse than normal. Pt vomited once after falling. Pt also has hx of concussion. Pt reports dizziness, nauseous and sleepy.

## 2015-01-28 NOTE — ED Notes (Signed)
Patient does not want his mom to visit until after the physician sees him.  Will not elaborate.

## 2015-01-28 NOTE — ED Provider Notes (Signed)
CSN: [id number]     Arrival date & time 01/28/15  2159 History   First MD Initiated Contact with Patient 01/28/15 2209     Chief Complaint  Patient presents with  . Fall     (Consider location/radiation/quality/duration/timing/severity/associated sxs/prior Treatment) Patient is a 24 y.o. male presenting with fall. The history is provided by the patient.  Fall This is a new problem. The current episode started today. Episode frequency: once. Associated symptoms include headaches, nausea, neck pain, numbness (of b/l upper extremities) and vomiting. Pertinent negatives include no abdominal pain, chest pain, chills, diaphoresis, fatigue, fever, rash, vertigo, visual change or weakness. Nothing aggravates the symptoms. He has tried nothing for the symptoms. The treatment provided no relief.    Past Medical History  Diagnosis Date  . Testicular torsion   . Neurogenic bladder disorder   . PTSD (post-traumatic stress disorder)   . Concussion    Past Surgical History  Procedure Laterality Date  . Testicle surgery    . Foot surgery    . Knee surgery    . Neck surgery    . Tympanostomy tube placement    . Hip surgery    . Posterior fusion cervical spine    . Calcaneal osteotomy w/ internal fixation    . Gastrocnemius recession    . Osteotomy tarsal     History reviewed. No pertinent family history. History  Substance Use Topics  . Smoking status: Never Smoker   . Smokeless tobacco: Not on file  . Alcohol Use: No    Review of Systems  Constitutional: Negative for fever, chills, diaphoresis and fatigue.  Respiratory: Negative for chest tightness and shortness of breath.   Cardiovascular: Negative for chest pain, palpitations and leg swelling.  Gastrointestinal: Positive for nausea and vomiting. Negative for abdominal pain.  Genitourinary:       H/o neurogenic bladder-self catheterizes   Musculoskeletal: Positive for neck pain. Negative for back pain and neck stiffness.  Skin:  Negative for color change, pallor, rash and wound.  Neurological: Positive for numbness (of b/l upper extremities) and headaches. Negative for dizziness, vertigo, syncope, facial asymmetry, speech difficulty, weakness and light-headedness.  All other systems reviewed and are negative.     Allergies  Bupropion; Duloxetine hcl; Citalopram; Nabumetone; Nitrofurantoin monohyd macro; and Tizanidine  Home Medications   Prior to Admission medications   Medication Sig Start Date End Date Taking? Authorizing Provider  baclofen (LIORESAL) 20 MG tablet Take 20 mg by mouth 4 (four) times daily.   Yes Historical Provider, MD   BP 120/71 mmHg  Pulse 95  Resp 18  Ht  (1.702 m)  SpO2 96% Physical Exam  Constitutional: He is oriented to person, place, and time. He appears well-developed and well-nourished. No distress.  HENT:  Head: Normocephalic and atraumatic.  Eyes: Conjunctivae and EOM are normal. Pupils are equal, round, and reactive to light.  Neck:  c-collar in place Unable to determine if he is tender over midline  Cardiovascular: Normal rate, regular rhythm, normal heart sounds and intact distal pulses.  Exam reveals no gallop and no friction rub.   No murmur heard. Pulmonary/Chest: Effort normal and breath sounds normal. No respiratory distress. He has no wheezes. He has no rales. He exhibits no tenderness.  Abdominal: Soft. Bowel sounds are normal. He exhibits no distension. There is no tenderness. There is no rebound and no guarding.  Neurological: He is alert and oriented to person, place, and time. He has normal strength. A sensory deficit  is present. No cranial nerve deficit. GCS eye subscore is 4. GCS verbal subscore is 5. GCS motor subscore is 6.  Globally decreased sensation to b/l upper extremities in no specific distribution.  Attempted testing sharp vs dull however pt states "I don't know" to all questioning; LE spasticity at baseline  Skin: Skin is warm and dry. No rash  noted. He is not diaphoretic. No erythema. No pallor.  Nursing note and vitals reviewed.   ED Course  Procedures (including critical care time) Labs Review Labs Reviewed - No data to display  Imaging Review Ct Head Wo Contrast  01/29/2015   CLINICAL DATA:  [place] from his wheel chair to a toilet. [facility] does [facility]. Pt is normally wheel chair bound, and has a hx of previous spinal injury, which has left him with minimal movement in his legs. Pt vomited once after falling. Pt also has hx of concussion. Pt reports dizziness, nauseous and sleepy.  EXAM: CT HEAD WITHOUT CONTRAST  CT CERVICAL SPINE WITHOUT CONTRAST  TECHNIQUE: Multidetector CT imaging of the head and cervical spine was performed following the standard protocol without intravenous contrast. Multiplanar CT image reconstructions of the cervical spine were also generated.  COMPARISON:  Head CT 07/12/2014  FINDINGS: CT HEAD FINDINGS  No intracranial hemorrhage. No parenchymal contusion. No midline shift or mass effect. Basilar cisterns are patent. No skull base fracture. No fluid in the paranasal sinuses or mastoid air cells. Orbits are normal.  CT CERVICAL SPINE FINDINGS  There is fusion posteriorly at C1 and C2 with pedicle screws and posterior fusion rods. Normal alignment of the vertebral bodies. No acute loss vertebral body height and disc height. Normal facet articulation. Normal craniocervical junction.  No epidural paraspinal hematoma.  IMPRESSION: 1. No intracranial trauma. 2. No acute cervical spine fracture. 3. Posterior cervical fusion at C1-C2   Electronically Signed   By: Genevive BiStewart  Edmunds M.D.   On: 01/29/2015 00:35   Ct Cervical Spine Wo Contrast  01/29/2015   CLINICAL DATA:  [place] from his wheel chair to a toilet. [facility]  falling. Pt is normally wheel chair bound, and has a hx of previous spinal injury, which has left him with minimal movement in his legs. Pt vomited once after falling. Pt also has hx of concussion. Pt reports dizziness, nauseous and sleepy.  EXAM: CT HEAD WITHOUT CONTRAST  CT CERVICAL SPINE WITHOUT CONTRAST  TECHNIQUE: Multidetector CT imaging of the head and cervical spine was performed following the standard protocol without intravenous contrast. Multiplanar CT image reconstructions of the cervical spine were also generated.  COMPARISON:  Head CT 07/12/2014  FINDINGS: CT HEAD FINDINGS  No intracranial hemorrhage. No parenchymal contusion. No midline shift or mass effect. Basilar cisterns are patent. No skull base fracture. No fluid in the paranasal sinuses or mastoid air cells. Orbits are normal.  CT CERVICAL SPINE FINDINGS  There is fusion posteriorly at C1 and C2 with pedicle screws and posterior fusion rods. Normal alignment of the vertebral bodies. No acute loss vertebral body height and disc height. Normal facet articulation. Normal craniocervical junction.  No epidural paraspinal hematoma.  IMPRESSION: 1. No intracranial trauma. 2. No acute cervical spine fracture. 3. Posterior cervical fusion at C1-C2   Electronically Signed   By: Loura HaltStewart  Edmunds M.D.  On: 01/29/2015 00:35     EKG Interpretation None      MDM   Final diagnoses:  None    24 yo M with PMH of lower extremity spasticity and neurogenic bladder of unknown etiology ([facility] c-spine fusion surgery, presenting with b/l upper extremity numbness after fall today.  Pt was transferring from wheelchair to toilet when he states he had a leg spasm causing him to lose balance and fall face forward.  No LOC.  Per pt's friend at bedside he was initially disoriented and vomited x 1 following fall.  Pt states he has numbness in various areas of his body occasionally but does not usually have numbness of arms.  He  states he has had a fracture at T10 causing lower extremity paralysis, however I see no documentation of this in his chart.  Per last Neuro clinic note, cause of his spasticity and neurogenic bladder are unknown, and there has been some [facility] conversion [facility] although he has not been formally diagnosed with this.  He reports mild HA. States he can't tell if he has neck pain.  Denies vision changes, weakness;  Reports LE weakness and spasticity are at baseline.    On presentation, pt alert, VSS, in NAD.  No external signs of trauma on exam. Pt is poor historian and difficult to get reliable physical exam- globally decreased sensation to b/l upper extremities in no specific distribution.  Attempted testing sharp vs dull however pt states "I don't know" to all questioning during this.  His strength is intact.  Pt again states he cannot tell if he has pain with palpation of c-spine;  No step-offs or deformities.  No other focal neuro deficits.  Plan for CT head given head injury, vomiting, initial disorientation.  CT c-spine to r/o acute fracture.    24:00:  Care of pt to Dr. Criss AlvineGoldston- imaging pending.  Plan for MR c-spine to r/o central cord syndrome or other cord abnormality if CT results negative.  Stable at time of transfer.  Discussed with attending Dr. Preston FleetingGlick.      Jodean LimaEmily Hilberto Burzynski, MD 01/29/15 0157  Dione Boozeavid Glick, MD 01/30/15 [phone]

## 2015-01-29 ENCOUNTER — Emergency Department (HOSPITAL_COMMUNITY): Payer: BC Managed Care – PPO

## 2015-01-29 NOTE — Discharge Instructions (Signed)
Fall Prevention and Home Safety Falls cause injuries and can affect all age groups. It is possible to use preventive measures to significantly decrease the likelihood of falls. There are many simple measures which can make your home safer and prevent falls. OUTDOORS  Repair cracks and edges of walkways and driveways.  Remove high doorway thresholds.  Trim shrubbery on the main path into your home.  Have good outside lighting.  Clear walkways of tools, rocks, debris, and clutter.  Check that handrails are not broken and are securely fastened. Both sides of steps should have handrails.  Have leaves, snow, and ice cleared regularly.  Use sand or salt on walkways during winter months.  In the garage, clean up grease or oil spills. BATHROOM  Install night lights.  Install grab bars by the toilet and in the tub and shower.  Use non-skid mats or decals in the tub or shower.  Place a plastic non-slip stool in the shower to sit on, if needed.  Keep floors dry and clean up all water on the floor immediately.  Remove soap buildup in the tub or shower on a regular basis.  Secure bath mats with non-slip, double-sided rug tape.  Remove throw rugs and tripping hazards from the floors. BEDROOMS  Install night lights.  Make sure a bedside light is easy to reach.  Do not use oversized bedding.  Keep a telephone by your bedside.  Have a firm chair with side arms to use for getting dressed.  Remove throw rugs and tripping hazards from the floor. KITCHEN  Keep handles on pots and pans turned toward the center of the stove. Use back burners when possible.  Clean up spills quickly and allow time for drying.  Avoid walking on wet floors.  Avoid hot utensils and knives.  Position shelves so they are not too high or low.  Place commonly used objects within easy reach.  If necessary, use a sturdy step stool with a grab bar when reaching.  Keep electrical cables out of the  way.  Do not use floor polish or wax that makes floors slippery. If you must use wax, use non-skid floor wax.  Remove throw rugs and tripping hazards from the floor. STAIRWAYS  Never leave objects on stairs.  Place handrails on both sides of stairways and use them. Fix any loose handrails. Make sure handrails on both sides of the stairways are as long as the stairs.  Check carpeting to make sure it is firmly attached along stairs. Make repairs to worn or loose carpet promptly.  Avoid placing throw rugs at the top or bottom of stairways, or properly secure the rug with carpet tape to prevent slippage. Get rid of throw rugs, if possible.  Have an electrician put in a light switch at the top and bottom of the stairs. OTHER FALL PREVENTION TIPS  Wear low-heel or rubber-soled shoes that are supportive and fit well. Wear closed toe shoes.  When using a stepladder, make sure it is fully opened and both spreaders are firmly locked. Do not climb a closed stepladder.  Add color or contrast paint or tape to grab bars and handrails in your home. Place contrasting color strips on first and last steps.  Learn and use mobility aids as needed. Install an electrical emergency response system.  Turn on lights to avoid dark areas. Replace light bulbs that burn out immediately. Get light switches that glow.  Arrange furniture to create clear pathways. Keep furniture in the same place.  Firmly attach carpet with non-skid or double-sided tape.  Eliminate uneven floor surfaces.  Select a carpet pattern that does not visually hide the edge of steps.  Be aware of all pets. OTHER HOME SAFETY TIPS  Set the water temperature for 120 F (48.8 C).  Keep emergency numbers on or near the telephone.  Keep smoke detectors on every level of the home and near sleeping areas. Document Released: 09/01/2002 Document Revised: 03/12/2012 Document Reviewed: 12/01/2011 [facility] Patient Information 2015  [place], [place]. This information is not intended to replace advice given to you by your health care provider. Make sure you discuss any questions you have with your health care provider.    Paresthesia Paresthesia is an abnormal burning or prickling sensation. This sensation is generally felt in the hands, arms, legs, or feet. However, it may occur in any part of the body. It is usually not painful. The feeling may be described as:  Tingling or numbness.  "Pins and needles."  Skin crawling.  Buzzing.  Limbs "falling asleep."  Itching. Most people experience temporary (transient) paresthesia at some time in their lives. CAUSES  Paresthesia may occur when you breathe too quickly (hyperventilation). It can also occur without any apparent cause. Commonly, paresthesia occurs when pressure is placed on a nerve. The feeling quickly goes away once the pressure is removed. For some people, however, paresthesia is a long-lasting (chronic) condition caused by an underlying disorder. The underlying disorder may be:  A traumatic, direct injury to nerves. Examples include a:  Broken (fractured) neck.  Fractured skull.  A disorder affecting the brain and spinal cord (central nervous system). Examples include:  Transverse myelitis.  Encephalitis.  Transient ischemic attack.  Multiple sclerosis.  Stroke.  Tumor or blood vessel problems, such as an arteriovenous malformation pressing against the brain or spinal cord.  A condition that damages the peripheral nerves (peripheral neuropathy). Peripheral nerves are not part of the brain and spinal cord. These conditions include:  Diabetes.  Peripheral vascular disease.  Nerve entrapment syndromes, such as carpal tunnel syndrome.  Shingles.  Hypothyroidism.  Vitamin B12 deficiencies.  Alcoholism.  Heavy metal poisoning (lead, arsenic).  Rheumatoid arthritis.  Systemic lupus erythematosus. DIAGNOSIS  Your caregiver will attempt  to find the underlying cause of your paresthesia. Your caregiver may:  Take your medical history.  Perform a physical exam.  Order various lab tests.  Order imaging tests. TREATMENT  Treatment for paresthesia depends on the underlying cause. HOME CARE INSTRUCTIONS  Avoid drinking alcohol.  You may consider massage or acupuncture to help relieve your symptoms.  Keep all follow-up appointments as directed by your caregiver. SEEK IMMEDIATE MEDICAL CARE IF:   You feel weak.  You have trouble walking or moving.  You have problems with speech or vision.  You feel confused.  You cannot control your bladder or bowel movements.  You feel numbness after an injury.  You faint.  Your burning or prickling feeling gets worse when walking.  You have pain, cramps, or dizziness.  You develop a rash. MAKE SURE YOU:  Understand these instructions.  Will watch your condition.  Will get help right away if you are not doing well or get worse. Document Released: 09/01/2002 Document Revised: 12/04/2011 Document Reviewed: 06/02/2011 [facility] Patient Information 2015 [place], [place]. This information is not intended to replace advice given to you by your health care provider. Make sure you discuss any questions you have with your health care provider.    Cervical Sprain A cervical sprain  is an injury in the neck in which the strong, fibrous tissues (ligaments) that connect your neck bones stretch or tear. Cervical sprains can range from mild to severe. Severe cervical sprains can cause the neck vertebrae to be unstable. This can lead to damage of the spinal cord and can result in serious nervous system problems. The amount of time it takes for a cervical sprain to get better depends on the cause and extent of the injury. Most cervical sprains heal in 1 to 3 weeks. CAUSES  Severe cervical sprains may be caused by:   Contact sport injuries (such as from football, rugby, wrestling, hockey,  auto racing, gymnastics, diving, martial arts, or boxing).   Motor vehicle collisions.   Whiplash injuries. This is an injury from a sudden forward and backward whipping movement of the head and neck.  Falls.  Mild cervical sprains may be caused by:   Being in an awkward position, such as while cradling a telephone between your ear and shoulder.   Sitting in a chair that does not offer proper support.   Working at a poorly Marketing executivedesigned computer station.   Looking up or down for long periods of time.  SYMPTOMS   Pain, soreness, stiffness, or a burning sensation in the front, back, or sides of the neck. This discomfort may develop immediately after the injury or slowly, 24 hours or more after the injury.   Pain or tenderness directly in the middle of the back of the neck.   Shoulder or upper back pain.   Limited ability to move the neck.   Headache.   Dizziness.   Weakness, numbness, or tingling in the hands or arms.   Muscle spasms.   Difficulty swallowing or chewing.   Tenderness and swelling of the neck.  DIAGNOSIS  Most of the time your health care provider can diagnose a cervical sprain by taking your history and doing a physical exam. Your health care provider will ask about previous neck injuries and any known neck problems, such as arthritis in the neck. X-rays may be taken to find out if there are any other problems, such as with the bones of the neck. Other tests, such as a CT scan or MRI, may also be needed.  TREATMENT  Treatment depends on the severity of the cervical sprain. Mild sprains can be treated with rest, keeping the neck in place (immobilization), and pain medicines. Severe cervical sprains are immediately immobilized. Further treatment is done to help with pain, muscle spasms, and other symptoms and may include:  Medicines, such as pain relievers, numbing medicines, or muscle relaxants.   Physical therapy. This may involve stretching  exercises, strengthening exercises, and posture training. Exercises and improved posture can help stabilize the neck, strengthen muscles, and help stop symptoms from returning.  HOME CARE INSTRUCTIONS   Put ice on the injured area.   Put ice in a plastic bag.   Place a towel between your skin and the bag.   Leave the ice on for 15-20 minutes, 3-4 times a day.   If your injury was severe, you may have been given a cervical collar to wear. A cervical collar is a two-piece collar designed to keep your neck from moving while it heals.  Do not remove the collar unless instructed by your health care provider.  If you have long hair, keep it outside of the collar.  Ask your health care provider before making any adjustments to your collar. Minor adjustments may be required  over time to improve comfort and reduce pressure on your chin or on the back of your head.  Ifyou are allowed to remove the collar for cleaning or bathing, follow your health care provider's instructions on how to do so safely.  Keep your collar clean by wiping it with mild soap and water and drying it completely. If the collar you have been given includes removable pads, remove them every 1-2 days and hand wash them with soap and water. Allow them to air dry. They should be completely dry before you wear them in the collar.  If you are allowed to remove the collar for cleaning and bathing, wash and dry the skin of your neck. Check your skin for irritation or sores. If you see any, tell your health care provider.  Do not drive while wearing the collar.   Only take over-the-counter or prescription medicines for pain, discomfort, or fever as directed by your health care provider.   Keep all follow-up appointments as directed by your health care provider.   Keep all physical therapy appointments as directed by your health care provider.   Make any needed adjustments to your workstation to promote good posture.    Avoid positions and activities that make your symptoms worse.   Warm up and stretch before being active to help prevent problems.  SEEK MEDICAL CARE IF:   Your pain is not controlled with medicine.   You are unable to decrease your pain medicine over time as planned.   Your activity level is not improving as expected.  SEEK IMMEDIATE MEDICAL CARE IF:   You develop any bleeding.  You develop stomach upset.  You have signs of an allergic reaction to your medicine.   Your symptoms get worse.   You develop new, unexplained symptoms.   You have numbness, tingling, weakness, or paralysis in any part of your body.  MAKE SURE YOU:   Understand these instructions.  Will watch your condition.  Will get help right away if you are not doing well or get worse. Document Released: 07/09/2007 Document Revised: 09/16/2013 Document Reviewed: 03/19/2013 [facility] Patient Information 2015 [place], [place]. This information is not intended to replace advice given to you by your health care provider. Make sure you discuss any questions you have with your health care provider.

## 2015-01-29 NOTE — ED Provider Notes (Signed)
Care transferred to me. CT scans are unremarkable. MRI obtained and shows no acute abnormalities including no obvious cord injury. Patient continues to have the nonspecific numbness to his upper extremities. He does state he's had this before but it feels little bit different. He will call his neurologist in the morning for close follow-up.  Ct Head Wo Contrast  01/29/2015   CLINICAL DATA:  [place] from his wheel chair to a toilet. [facility] but does [facility]. Pt is normally wheel chair bound, and has a hx of previous spinal injury, which has left him with minimal movement in his legs. Pt vomited once after falling. Pt also has hx of concussion. Pt reports dizziness, nauseous and sleepy.  EXAM: CT HEAD WITHOUT CONTRAST  CT CERVICAL SPINE WITHOUT CONTRAST  TECHNIQUE: Multidetector CT imaging of the head and cervical spine was performed following the standard protocol without intravenous contrast. Multiplanar CT image reconstructions of the cervical spine were also generated.  COMPARISON:  Head CT 07/12/2014  FINDINGS: CT HEAD FINDINGS  No intracranial hemorrhage. No parenchymal contusion. No midline shift or mass effect. Basilar cisterns are patent. No skull base fracture. No fluid in the paranasal sinuses or mastoid air cells. Orbits are normal.  CT CERVICAL SPINE FINDINGS  There is fusion posteriorly at C1 and C2 with pedicle screws and posterior fusion rods. Normal alignment of the vertebral bodies. No acute loss vertebral body height and disc height. Normal facet articulation. Normal craniocervical junction.  No epidural paraspinal hematoma.  IMPRESSION: 1. No intracranial trauma. 2. No acute cervical spine fracture. 3. Posterior cervical fusion at C1-C2   Electronically Signed   By: Genevive BiStewart  Edmunds M.D.   On: 01/29/2015 00:35   Ct Cervical Spine Wo Contrast  01/29/2015   CLINICAL DATA:  [place] while transferring from his wheel chair to a toilet. [facility] but does remember [facility]. Pt is normally wheel chair bound, and has a hx of previous spinal injury, which has left him with minimal movement in his legs. Pt vomited once after falling. Pt also has hx of concussion. Pt reports dizziness, nauseous and sleepy.  EXAM: CT HEAD WITHOUT CONTRAST  CT CERVICAL SPINE WITHOUT CONTRAST  TECHNIQUE: Multidetector CT imaging of the head and cervical spine was performed following the standard protocol without intravenous contrast. Multiplanar CT image reconstructions of the cervical spine were also generated.  COMPARISON:  Head CT 07/12/2014  FINDINGS: CT HEAD FINDINGS  No intracranial hemorrhage. No parenchymal contusion. No midline shift or mass effect. Basilar cisterns are patent. No skull base fracture. No fluid in the paranasal sinuses or mastoid air cells. Orbits are normal.  CT CERVICAL SPINE FINDINGS  There is fusion posteriorly at C1 and C2 with pedicle screws and posterior fusion rods. Normal alignment of the vertebral bodies. No acute loss vertebral body height and disc height. Normal facet articulation. Normal craniocervical junction.  No epidural paraspinal hematoma.  IMPRESSION: 1. No intracranial trauma. 2. No acute cervical spine fracture. 3. Posterior cervical fusion at C1-C2   Electronically Signed   By: Genevive BiStewart  Edmunds M.D.   On: 01/29/2015 00:35   Mr Cervical Spine Wo Contrast  01/29/2015   CLINICAL DATA:  Larey SeatFell forward well transferring from wheelchair to bathroom. Bilateral upper extremity numbness, headache, neck pain. Wheelchair bound, remote history of spinal injury. Evaluate spinal  cord.  EXAM: MRI CERVICAL SPINE WITHOUT CONTRAST  TECHNIQUE: Multiplanar, multisequence MR imaging of the cervical spine was performed. No intravenous contrast was administered.  COMPARISON:  CT of the cervical spine Jan 28, 2015 and MRI of the cervical spine October 05, 2011  FINDINGS: Cervical vertebral bodies intact and aligned, maintenance of cervical lordosis. C1-2 lateral mass screws resultant susceptibility artifact. Remote unfused odontoid fracture, better seen on prior CT. No STIR signal abnormality to suggest acute fracture. There remaining cervical vertebral bodies intact. No malalignment. Straightened cervical lordosis. Mild desiccation of the C5-6 disc. Minimal chronic discogenic endplate changes C5-6.  Cervical spinal cord appears normal morphology and signal characteristics from the cervical medullary junction to the level of T1-2, the most caudal well visualized level. Susceptibility artifact within the paramedian posterior soft tissues consistent with prior surgery. Included prevertebral soft tissues are unremarkable.  Level by level evaluation:  C2-3: Posterior instrumentation, no canal stenosis or neural foraminal narrowing.  C3-4, C4-5: No disc bulge, canal stenosis nor neural foraminal narrowing.  C5-6: Tiny central disc protrusion without canal stenosis or neural foraminal narrowing.  C6-7 and C7-T1: No disc bulge, canal stenosis nor neural foraminal narrowing.  IMPRESSION: Straightened cervical lordosis without acute fracture nor malalignment. Normal noncontrast MRI of the cervical spinal cord though, susceptibility artifact at the upper cervical spine from lateral mass screws somewhat limits evaluation focally.  No neurocompressive changes.   Electronically Signed   By: Awilda Metroourtnay  Bloomer   On: 01/29/2015 03:18      Pricilla LovelessScott Donevin Sainsbury, MD 01/29/15 [phone]

## 2015-04-28 ENCOUNTER — Emergency Department (HOSPITAL_COMMUNITY)
Admission: EM | Admit: 2015-04-28 | Discharge: 2015-04-28 | Disposition: A | Discharge status: Home / Self Care | Payer: BC Managed Care – PPO | Attending: Emergency Medicine | Admitting: Emergency Medicine

## 2015-04-28 ENCOUNTER — Emergency Department (HOSPITAL_COMMUNITY): Payer: BC Managed Care – PPO

## 2015-04-28 ENCOUNTER — Encounter (HOSPITAL_COMMUNITY): Payer: Self-pay | Admitting: Emergency Medicine

## 2015-04-28 DIAGNOSIS — Z9889 Other specified postprocedural states: Secondary | ICD-10-CM | POA: Insufficient documentation

## 2015-04-28 DIAGNOSIS — Y998 Other external cause status: Secondary | ICD-10-CM | POA: Diagnosis not present

## 2015-04-28 DIAGNOSIS — S4991XA Unspecified injury of right shoulder and upper arm, initial encounter: Secondary | ICD-10-CM | POA: Insufficient documentation

## 2015-04-28 DIAGNOSIS — Z8659 Personal history of other mental and behavioral disorders: Secondary | ICD-10-CM | POA: Diagnosis not present

## 2015-04-28 DIAGNOSIS — Z87438 Personal history of other diseases of male genital organs: Secondary | ICD-10-CM | POA: Insufficient documentation

## 2015-04-28 DIAGNOSIS — X58XXXA Exposure to other specified factors, initial encounter: Secondary | ICD-10-CM | POA: Insufficient documentation

## 2015-04-28 DIAGNOSIS — Y9289 Other specified places as the place of occurrence of the external cause: Secondary | ICD-10-CM | POA: Diagnosis not present

## 2015-04-28 DIAGNOSIS — Z79899 Other long term (current) drug therapy: Secondary | ICD-10-CM | POA: Insufficient documentation

## 2015-04-28 DIAGNOSIS — Z87448 Personal history of other diseases of urinary system: Secondary | ICD-10-CM | POA: Insufficient documentation

## 2015-04-28 DIAGNOSIS — M25519 Pain in unspecified shoulder: Secondary | ICD-10-CM

## 2015-04-28 DIAGNOSIS — Y9389 Activity, other specified: Secondary | ICD-10-CM | POA: Diagnosis not present

## 2015-04-28 DIAGNOSIS — R200 Anesthesia of skin: Secondary | ICD-10-CM

## 2015-04-28 MED ORDER — HYDROCODONE-ACETAMINOPHEN 5-325 MG PO TABS
1.0000 | ORAL_TABLET | Freq: Once | ORAL | Status: AC
  Administered 2015-04-28: 1 via ORAL
  Filled 2015-04-28: qty 1

## 2015-04-28 NOTE — ED Notes (Signed)
Pt self cath for urination.  Unable to do so by self with arm injury.  In/Out ordered with tech assist

## 2015-04-28 NOTE — ED Provider Notes (Signed)
Medical screening examination/treatment/procedure(s) were conducted as a shared visit with non-physician practitioner(s) and myself.  I personally evaluated the patient during the encounter.  I saw patient with APP and agree with the assessment.   Patient is presenting with right shoulder pain. 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 Patient initially stated numbness and tingling and inability to move arm.  Now it has largely resolved and he can move his hand pretty well. X-ray showed no acute fractures. We will get CT neck and shoulder given the continued symptoms. Patient has history of cervical fusion in. 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 Concern for an occult fracture.  Patient's orthopedist was called. They report that he does have chronic shoulder pain. If there are any cervical spine issues they recommended calling the orthopedist on call. (not them)  Castiel Lauricella Randall An, MD 04/28/15 [id number]

## 2015-04-28 NOTE — Discharge Instructions (Signed)
Shoulder Pain Juan Blake, your CT scan and xrays were normal. See your primary care doctor within 3 days for close follow up. If symptoms worsen, come back to emergency department immediately. Thank you. The shoulder is the joint that connects your arm to your body. Muscles and band-like tissues that connect bones to muscles (tendons) hold the joint together. Shoulder pain is felt if an injury or medical problem affects one or more parts of the shoulder. HOME CARE   Put ice on the sore area.  Put ice in a plastic bag.  Place a towel between your skin and the bag.  Leave the ice on for 15-20 minutes, 03-04 times a day for the first 2 days.  Stop using cold packs if they do not help with the pain.  If you were given something to keep your shoulder from moving (sling; shoulder immobilizer), wear it as told. Only take it off to shower or bathe.  Move your arm as little as possible, but keep your hand moving to prevent puffiness (swelling).  Squeeze a soft ball or foam pad as much as possible to help prevent swelling.  Take medicine as told by your doctor. GET HELP IF:  You have progressing new pain in your arm, hand, or fingers.  Your hand or fingers get cold.  Your medicine does not help lessen your pain. GET HELP RIGHT AWAY IF:   Your arm, hand, or fingers are numb or tingling.  Your arm, hand, or fingers are puffy (swollen), painful, or turn white or blue. MAKE SURE YOU:   Understand these instructions.  Will watch your condition.  Will get help right away if you are not doing well or get worse. Document Released: 02/28/2008 Document Revised: 01/26/2014 Document Reviewed: 03/25/2012 [facility] Patient Information 2015 [place], [place]. This information is not intended to replace advice given to you by your health care provider. Make sure you discuss any questions you have with your health care provider.

## 2015-04-28 NOTE — ED Notes (Signed)
PT request to only have a male tech/nurse to do cath

## 2015-04-28 NOTE — ED Notes (Signed)
PA at bedside.

## 2015-04-28 NOTE — ED Notes (Signed)
Per pt, states B/L shoulder pain that started 30 minutes ago-

## 2015-04-28 NOTE — Progress Notes (Signed)
Pt informed Cm his pcp was cody [employer] EPIC updated

## 2015-04-28 NOTE — ED Provider Notes (Signed)
CSN: [id number]     Arrival date & time 04/28/15  1241 History   First MD Initiated Contact with Patient 04/28/15 1258     Chief Complaint  Patient presents with  . Shoulder Pain     (Consider location/radiation/quality/duration/timing/severity/associated sxs/prior Treatment) Patient is a 24 y.o. male presenting with shoulder pain. The history is provided by the patient and medical records. No language interpreter was used.  Shoulder Pain Associated symptoms: no back pain, no fever and no neck pain     Juan Blake is a 24 y.o. 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 He reports that he heard a loud pop and immediately had loss of sensation in the right upper extremity.  Patient reports that he has a history of recurrent shoulder dislocations and this pain feels similar however he reports he has never had numbness in his upper extremity like this. Patient reports he takes baclofen throughout the day. At noon he took 40 mg of baclofen. Patient reports significantly decreased range of motion of the right shoulder due to pain and spasticity.  Patient is followed at [facility] orthopedics and discussed his situation with Shari Prows, PA-C who recommended he present here to the emergency department.   [facility] - Dr. Williams Che and Jake Shark Rudicil, PA-C.     Past Medical History  Diagnosis Date  . Testicular torsion   . Neurogenic bladder disorder   . PTSD (post-traumatic stress disorder)   . Concussion    Past Surgical History  Procedure Laterality Date  . Testicle surgery    . Foot surgery    . Knee surgery    . Neck surgery    . Tympanostomy tube placement    . Hip surgery    . Posterior fusion cervical spine    . Calcaneal osteotomy w/ internal fixation    . Gastrocnemius  recession    . Osteotomy tarsal     No family history on file. History  Substance Use Topics  . Smoking status: Never Smoker   . Smokeless tobacco: Not on file  . Alcohol Use: No    Review of Systems  Constitutional: Negative for fever and chills.  Gastrointestinal: Negative for nausea and vomiting.  Musculoskeletal: Positive for joint swelling and arthralgias ( Right shoulder). Negative for back pain, neck pain and neck stiffness.  Skin: Negative for wound.  Neurological: Negative for numbness.  Hematological: Does not bruise/bleed easily.  Psychiatric/Behavioral: The patient is not nervous/anxious.   All other systems reviewed and are negative.     Allergies  Bupropion; Duloxetine hcl; Citalopram; Nabumetone; Nitrofurantoin monohyd macro; and Tizanidine  Home Medications   Prior to Admission medications   Medication Sig Start Date End Date Taking? Authorizing Provider  atomoxetine (STRATTERA) 25 MG capsule Take 25 mg by mouth daily. 04/12/15 04/11/16 Yes Historical Provider, MD  baclofen (LIORESAL) 20 MG tablet Take 20 mg by mouth 4 (four) times daily.   Yes Historical Provider, MD  bisacodyl (DULCOLAX) 10 MG suppository Place 10 mg rectally as needed for mild constipation or moderate constipation.   Yes Historical Provider, MD   BP 149/89 mmHg  Pulse 101  Temp(Src) 97.7 F (36.5 C) (Oral)  Resp 16  SpO2 96% Physical Exam  Constitutional: He appears well-developed and well-nourished. No distress.  HENT:  Head:  Normocephalic and atraumatic.  Eyes: Conjunctivae are normal.  Neck: Normal range of motion.  Cardiovascular: Normal rate, regular rhythm, normal heart sounds and intact distal pulses.   No murmur heard. Capillary refill < 3 sec  Pulmonary/Chest: Effort normal and breath sounds normal.  Musculoskeletal: He exhibits tenderness. He exhibits no edema.  ROM: almost no ROM of the right shoulder due to pain and severe muscle spasm.  No palpable deformity of the  neck, shoulder or clavicle.   FROM of the cervical spine without pain; no deformity or step off of the c-spine or t-spine  Neurological: He is alert. Coordination normal.  Sensation intact to dull and sharp in the left upper extremity; absent in the bilateral lower extremities and right upper extremity Strength 5/5 in the left upper extremity, 4/5 with flexion of the elbow with significant muscle spasm; 0/5 of the right upper shoulder  Skin: Skin is warm and dry. He is not diaphoretic.  No tenting of the skin  Psychiatric: He has a normal mood and affect.  Nursing note and vitals reviewed.   ED Course  Procedures (including critical care time) Labs Review Labs Reviewed - No data to display  Imaging Review Dg Cervical Spine Complete  04/28/2015   CLINICAL DATA:  Larey Seat while transferring from wheelchair today.  EXAM: RIGHT SHOULDER - 2+ VIEW; RIGHT CLAVICLE - 2+ VIEWS; CERVICAL SPINE - COMPLETE 4+ VIEW  COMPARISON:  None.  FINDINGS: Right shoulder:  The joint spaces are maintained. No acute fracture or abnormal soft tissue calcifications. The visualized right lung is clear.  Right clavicle:  The sternoclavicular and AC joints are intact. No acute clavicle fracture. No AC joint separation.  Cervical spine series:  Posterior fusion hardware noted at C1-2. No complicating features. The cervical vertebral bodies are normally aligned. No acute fracture. The facets are normally aligned. The lung apices are clear.  IMPRESSION: Remote posterior fusion changes at C1-2.  No acute bony abnormality.   Electronically Signed   By: Rudie Meyer M.D.   On: 04/28/2015 15:03   Dg Clavicle Right  04/28/2015   CLINICAL DATA:  Larey Seat while transferring from wheelchair today.  EXAM: RIGHT SHOULDER - 2+ VIEW; RIGHT CLAVICLE - 2+ VIEWS; CERVICAL SPINE - COMPLETE 4+ VIEW  COMPARISON:  None.  FINDINGS: Right shoulder:  The joint spaces are maintained. No acute fracture or abnormal soft tissue calcifications. The visualized  right lung is clear.  Right clavicle:  The sternoclavicular and AC joints are intact. No acute clavicle fracture. No AC joint separation.  Cervical spine series:  Posterior fusion hardware noted at C1-2. No complicating features. The cervical vertebral bodies are normally aligned. No acute fracture. The facets are normally aligned. The lung apices are clear.  IMPRESSION: Remote posterior fusion changes at C1-2.  No acute bony abnormality.   Electronically Signed   By: Rudie Meyer M.D.   On: 04/28/2015 15:03   Dg Shoulder Right  04/28/2015   CLINICAL DATA:  Larey Seat while transferring from wheelchair today.  EXAM: RIGHT SHOULDER - 2+ VIEW; RIGHT CLAVICLE - 2+ VIEWS; CERVICAL SPINE - COMPLETE 4+ VIEW  COMPARISON:  None.  FINDINGS: Right shoulder:  The joint spaces are maintained. No acute fracture or abnormal soft tissue calcifications. The visualized right lung is clear.  Right clavicle:  The sternoclavicular and AC joints are intact. No acute clavicle fracture. No AC joint separation.  Cervical spine series:  Posterior fusion hardware noted at C1-2. No complicating features. The cervical vertebral bodies are  normally aligned. No acute fracture. The facets are normally aligned. The lung apices are clear.  IMPRESSION: Remote posterior fusion changes at C1-2.  No acute bony abnormality.   Electronically Signed   By: Rudie Meyer M.D.   On: 04/28/2015 15:03     EKG Interpretation None      MDM   Final diagnoses:  Right arm numbness    BOHDI LEEDS presents of right shoulder pain and concern for possible dislocation. No palpable dislocation however patient with loss of sensation to the right upper shin many. Concern for possible cervical spine injury versus shoulder dislocation. We'll obtain plain films and reassess.  The patient was discussed with and seen by Dr. Corlis Leak who agrees with the treatment plan.  3:23 PM X-rays without evidence of dislocation.  Will consult with his orthopedist and  obtain a CT scan of the cervical spine, right shoulder and proximal T-spine.   Pt was discussed with Jake Shark Rudicil who reports that pt may f/u with them on the outpatient, but they do not have spine specialists.    5:02 PM Care transferred to Dr. Mora Bellman.  Patient is pending a CT scan of the cervical spine and right shoulder. It is negative he'll be discharged home. If concerning findings are found the on-call neurosurgeon should be contacted.  BP 149/89 mmHg  Pulse 101  Temp(Src) 97.7 F (36.5 C) (Oral)  Resp 16  SpO2 96%   Dierdre Forth, PA-C 04/28/15 1705  Courteney Lyn Mackuen, MD 04/29/15 [phone]

## 2015-04-28 NOTE — ED Notes (Signed)
Bed: ZO10 Expected date:  Expected time:  Means of arrival:  Comments: Hold for [facility] transfer for surgery

## 2015-04-29 ENCOUNTER — Emergency Department (HOSPITAL_BASED_OUTPATIENT_CLINIC_OR_DEPARTMENT_OTHER)
Admission: EM | Admit: 2015-04-29 | Discharge: 2015-04-29 | Disposition: A | Discharge status: Home / Self Care | Payer: BC Managed Care – PPO | Attending: Emergency Medicine | Admitting: Emergency Medicine

## 2015-04-29 ENCOUNTER — Encounter (HOSPITAL_BASED_OUTPATIENT_CLINIC_OR_DEPARTMENT_OTHER): Payer: Self-pay

## 2015-04-29 ENCOUNTER — Emergency Department (HOSPITAL_BASED_OUTPATIENT_CLINIC_OR_DEPARTMENT_OTHER): Payer: BC Managed Care – PPO

## 2015-04-29 DIAGNOSIS — X58XXXA Exposure to other specified factors, initial encounter: Secondary | ICD-10-CM | POA: Insufficient documentation

## 2015-04-29 DIAGNOSIS — Y9389 Activity, other specified: Secondary | ICD-10-CM | POA: Insufficient documentation

## 2015-04-29 DIAGNOSIS — Y9289 Other specified places as the place of occurrence of the external cause: Secondary | ICD-10-CM | POA: Insufficient documentation

## 2015-04-29 DIAGNOSIS — Z8659 Personal history of other mental and behavioral disorders: Secondary | ICD-10-CM | POA: Diagnosis not present

## 2015-04-29 DIAGNOSIS — Z87438 Personal history of other diseases of male genital organs: Secondary | ICD-10-CM | POA: Insufficient documentation

## 2015-04-29 DIAGNOSIS — R200 Anesthesia of skin: Secondary | ICD-10-CM | POA: Diagnosis not present

## 2015-04-29 DIAGNOSIS — T1490XA Injury, unspecified, initial encounter: Secondary | ICD-10-CM

## 2015-04-29 DIAGNOSIS — G8929 Other chronic pain: Secondary | ICD-10-CM | POA: Diagnosis not present

## 2015-04-29 DIAGNOSIS — Z79899 Other long term (current) drug therapy: Secondary | ICD-10-CM | POA: Insufficient documentation

## 2015-04-29 DIAGNOSIS — S4991XA Unspecified injury of right shoulder and upper arm, initial encounter: Secondary | ICD-10-CM | POA: Diagnosis present

## 2015-04-29 DIAGNOSIS — M25511 Pain in right shoulder: Secondary | ICD-10-CM

## 2015-04-29 DIAGNOSIS — Y998 Other external cause status: Secondary | ICD-10-CM | POA: Insufficient documentation

## 2015-04-29 NOTE — ED Provider Notes (Signed)
CSN: [id number]     Arrival date & time 04/29/15  1240 History   First MD Initiated Contact with Patient 04/29/15 1241     Chief Complaint  Patient presents with  . Shoulder Injury     (Consider location/radiation/quality/duration/timing/severity/associated sxs/prior Treatment) HPI Comments: Patient with a history of a T12 spinal injury with paralysis presents with right shoulder pain. He has chronic dislocations of his right shoulder with chronic subluxation. He also has chronic spasticity in the right shoulder. He states that today he was reaching for a door to close it and felt a pop in his shoulder and feels like it's dislocated. He's had some worsening spasms around his right shoulder. He's trying to get a baclofen pump for symptom management. He's followed by an orthopedist to [facility]. He's been seen 3 times in the last week for shoulder pain. He was seen at [facility] ED on July 31 and had a negative x-ray. He was seen in the next day by his orthopedist to also do an x-ray which was negative. He was seen yesterday at the [facility] ED and had an x-ray that was negative for dislocation and also had a CT of the cervical spine and thoracic spine which didn't show any acute injury. He states today that he feels like his shoulder is dislocated. He also has numbness in his right hand which he says comes and goes after he has an injury like this.  Patient is a 24 y.o. male presenting with shoulder injury.  Shoulder Injury Pertinent negatives include no headaches.    Past Medical History  Diagnosis Date  . Testicular torsion   . Neurogenic bladder disorder   . PTSD (post-traumatic stress disorder)   . Concussion    Past Surgical History  Procedure Laterality Date  . Testicle surgery    . Foot surgery    . Knee surgery    . Neck surgery    . Tympanostomy tube placement    . Hip surgery    . Posterior fusion cervical spine    . Calcaneal osteotomy w/ internal fixation    .  Gastrocnemius recession    . Osteotomy tarsal     No family history on file. History  Substance Use Topics  . Smoking status: Never Smoker   . Smokeless tobacco: Not on file  . Alcohol Use: No    Review of Systems  Constitutional: Negative for fever.  Gastrointestinal: Negative for nausea and vomiting.  Musculoskeletal: Positive for arthralgias. Negative for back pain, joint swelling and neck pain.  Skin: Negative for wound.  Neurological: Positive for numbness. Negative for weakness and headaches.      Allergies  Bupropion; Duloxetine hcl; Citalopram; Nabumetone; Nitrofurantoin monohyd macro; and Tizanidine  Home Medications   Prior to Admission medications   Medication Sig Start Date End Date Taking? Authorizing Provider  atomoxetine (STRATTERA) 25 MG capsule Take 25 mg by mouth daily. 04/12/15 04/11/16  Historical Provider, MD  baclofen (LIORESAL) 20 MG tablet Take 20 mg by mouth 4 (four) times daily.    Historical Provider, MD  bisacodyl (DULCOLAX) 10 MG suppository Place 10 mg rectally as needed for mild constipation or moderate constipation.    Historical Provider, MD   BP 156/96 mmHg  Pulse 105  Temp(Src) 98.5 F (36.9 C) (Oral)  Resp 18  Ht 5\' 7"  (1.702 m)  Wt 200 lb (90.719 kg)  BMI 31.32 kg/m2  SpO2 98% Physical Exam  Constitutional: He is oriented to person, place, and  time. He appears well-developed and well-nourished.  HENT:  Head: Normocephalic and atraumatic.  Neck: Normal range of motion. Neck supple.  Cardiovascular: Normal rate.   Pulmonary/Chest: Effort normal.  Musculoskeletal: He exhibits tenderness. He exhibits no edema.  Patient has pain to the anterior right shoulder. There is pain on range of motion the shoulder. He has a lot of spasticity in the right shoulder with limited external rotation and abduction. He has no pain in the elbow or the wrist. Pulses are intact. He has some diminished sensation throughout the right hand. He does have some  contractures the right hand but is able to do a thumbs up and cross his fingers. He is able to extend his wrist as well.  Neurological: He is alert and oriented to person, place, and time.  Skin: Skin is warm and dry.  Psychiatric: He has a normal mood and affect.    ED Course  Procedures (including critical care time) Labs Review Labs Reviewed - No data to display  Imaging Review Dg Cervical Spine Complete  04/28/2015   CLINICAL DATA:  Larey Seat while transferring from wheelchair today.  EXAM: RIGHT SHOULDER - 2+ VIEW; RIGHT CLAVICLE - 2+ VIEWS; CERVICAL SPINE - COMPLETE 4+ VIEW  COMPARISON:  None.  FINDINGS: Right shoulder:  The joint spaces are maintained. No acute fracture or abnormal soft tissue calcifications. The visualized right lung is clear.  Right clavicle:  The sternoclavicular and AC joints are intact. No acute clavicle fracture. No AC joint separation.  Cervical spine series:  Posterior fusion hardware noted at C1-2. No complicating features. The cervical vertebral bodies are normally aligned. No acute fracture. The facets are normally aligned. The lung apices are clear.  IMPRESSION: Remote posterior fusion changes at C1-2.  No acute bony abnormality.   Electronically Signed   By: Rudie Meyer M.D.   On: 04/28/2015 15:03   Dg Clavicle Right  04/28/2015   CLINICAL DATA:  Larey Seat while transferring from wheelchair today.  EXAM: RIGHT SHOULDER - 2+ VIEW; RIGHT CLAVICLE - 2+ VIEWS; CERVICAL SPINE - COMPLETE 4+ VIEW  COMPARISON:  None.  FINDINGS: Right shoulder:  The joint spaces are maintained. No acute fracture or abnormal soft tissue calcifications. The visualized right lung is clear.  Right clavicle:  The sternoclavicular and AC joints are intact. No acute clavicle fracture. No AC joint separation.  Cervical spine series:  Posterior fusion hardware noted at C1-2. No complicating features. The cervical vertebral bodies are normally aligned. No acute fracture. The facets are normally aligned. The  lung apices are clear.  IMPRESSION: Remote posterior fusion changes at C1-2.  No acute bony abnormality.   Electronically Signed   By: Rudie Meyer M.D.   On: 04/28/2015 15:03   Dg Shoulder Right  04/29/2015   CLINICAL DATA:  Right shoulder pain after injuring it today with his wheelchair. Initial encounter.  EXAM: RIGHT SHOULDER - 2+ VIEW  COMPARISON:  04/28/2015  FINDINGS: There is no evidence of fracture or dislocation. There is no evidence of arthropathy or other focal bone abnormality. Soft tissues are unremarkable.  IMPRESSION: Negative.   Electronically Signed   By: Sebastian Ache   On: 04/29/2015 13:36   Dg Shoulder Right  04/28/2015   CLINICAL DATA:  Larey Seat while transferring from wheelchair today.  EXAM: RIGHT SHOULDER - 2+ VIEW; RIGHT CLAVICLE - 2+ VIEWS; CERVICAL SPINE - COMPLETE 4+ VIEW  COMPARISON:  None.  FINDINGS: Right shoulder:  The joint spaces are maintained. No acute fracture or  abnormal soft tissue calcifications. The visualized right lung is clear.  Right clavicle:  The sternoclavicular and AC joints are intact. No acute clavicle fracture. No AC joint separation.  Cervical spine series:  Posterior fusion hardware noted at C1-2. No complicating features. The cervical vertebral bodies are normally aligned. No acute fracture. The facets are normally aligned. The lung apices are clear.  IMPRESSION: Remote posterior fusion changes at C1-2.  No acute bony abnormality.   Electronically Signed   By: Rudie Meyer M.D.   On: 04/28/2015 15:03   Ct Cervical Spine Wo Contrast  04/28/2015   CLINICAL DATA:  No injury. Numbness right arm with right shoulder pain. Assess cervical spine through T5.  EXAM: CT CERVICAL SPINE WITHOUT CONTRAST  CT THORACIC SPINE WITHOUT CONTRAST  TECHNIQUE: Multidetector CT imaging of the cervical and thoracic spine to the T5 level was performed without contrast. Multiplanar CT image reconstructions were also generated.  COMPARISON:  None.  FINDINGS: CT CERVICAL SPINE FINDINGS   Evidence of posterior fusion hardware at the C1-2 level intact. Evidence of old dens fracture. Otherwise, vertebral body alignment, heights and disc spaces are within normal. Prevertebral soft tissues are normal. There is no acute fracture or subluxation.  CT THORACIC SPINE FINDINGS  Images over the upper thoracic spine to the T5 level demonstrate normal vertebral body heights and disc space heights. There is subtle curvature convex to the left which may be positional nature. There is no acute fracture or subluxation. Visualize lungs are within normal. Remaining bones and soft tissues are within normal.  IMPRESSION: No acute cervical or thoracic spine injury.  Posterior fusion hardware intact at the C1-2 level.   Electronically Signed   By: Elberta Fortis M.D.   On: 04/28/2015 17:14   Ct T Spine Ltd Wo Or W/ Cm  04/28/2015   CLINICAL DATA:  No injury. Numbness right arm with right shoulder pain. Assess cervical spine through T5.  EXAM: CT CERVICAL SPINE WITHOUT CONTRAST  CT THORACIC SPINE WITHOUT CONTRAST  TECHNIQUE: Multidetector CT imaging of the cervical and thoracic spine to the T5 level was performed without contrast. Multiplanar CT image reconstructions were also generated.  COMPARISON:  None.  FINDINGS: CT CERVICAL SPINE FINDINGS  Evidence of posterior fusion hardware at the C1-2 level intact. Evidence of old dens fracture. Otherwise, vertebral body alignment, heights and disc spaces are within normal. Prevertebral soft tissues are normal. There is no acute fracture or subluxation.  CT THORACIC SPINE FINDINGS  Images over the upper thoracic spine to the T5 level demonstrate normal vertebral body heights and disc space heights. There is subtle curvature convex to the left which may be positional nature. There is no acute fracture or subluxation. Visualize lungs are within normal. Remaining bones and soft tissues are within normal.  IMPRESSION: No acute cervical or thoracic spine injury.  Posterior fusion  hardware intact at the C1-2 level.   Electronically Signed   By: Elberta Fortis M.D.   On: 04/28/2015 17:14     EKG Interpretation None      MDM   Final diagnoses:  Chronic shoulder pain, right    I am unable to tell 100% clinically the patient does not have a dislocation as he has very limited range of motion of the right shoulder. I advised the patient that I was reluctant to do another x-ray is a dirty had 3 this week. However he feels with this new injury today that his shoulder is dislocated and wants to make  sure that it's in place. Given this I will reluctantly do another x-ray of his right shoulder today.  Shoulder x-ray is negative for acute trauma. He was discharged home to follow-up with his orthopedist.  Rolan Bucco, MD 04/29/15 [phone]

## 2015-04-29 NOTE — Discharge Instructions (Signed)

## 2015-04-29 NOTE — ED Notes (Addendum)
?  injury to right injury while pushing w/c last week and again today-pt states he heard a loud pop at work today while pushing w/c-pt is able to push self in own w/c with both arms

## 2015-04-29 NOTE — ED Notes (Signed)
R Hess, PA order to cancel shoulder xray at this time

## 2015-05-24 ENCOUNTER — Encounter (HOSPITAL_BASED_OUTPATIENT_CLINIC_OR_DEPARTMENT_OTHER): Payer: Self-pay | Admitting: *Deleted

## 2015-05-24 ENCOUNTER — Emergency Department (HOSPITAL_BASED_OUTPATIENT_CLINIC_OR_DEPARTMENT_OTHER)
Admission: EM | Admit: 2015-05-24 | Discharge: 2015-05-24 | Disposition: A | Discharge status: Home / Self Care | Payer: BC Managed Care – PPO | Attending: Emergency Medicine | Admitting: Emergency Medicine

## 2015-05-24 DIAGNOSIS — T839XXA Unspecified complication of genitourinary prosthetic device, implant and graft, initial encounter: Secondary | ICD-10-CM

## 2015-05-24 DIAGNOSIS — Z8669 Personal history of other diseases of the nervous system and sense organs: Secondary | ICD-10-CM | POA: Insufficient documentation

## 2015-05-24 DIAGNOSIS — T83038A Leakage of other indwelling urethral catheter, initial encounter: Secondary | ICD-10-CM | POA: Diagnosis not present

## 2015-05-24 DIAGNOSIS — N4889 Other specified disorders of penis: Secondary | ICD-10-CM | POA: Insufficient documentation

## 2015-05-24 DIAGNOSIS — Z8719 Personal history of other diseases of the digestive system: Secondary | ICD-10-CM | POA: Insufficient documentation

## 2015-05-24 DIAGNOSIS — Z79899 Other long term (current) drug therapy: Secondary | ICD-10-CM | POA: Diagnosis not present

## 2015-05-24 DIAGNOSIS — Z87828 Personal history of other (healed) physical injury and trauma: Secondary | ICD-10-CM | POA: Insufficient documentation

## 2015-05-24 DIAGNOSIS — Z8659 Personal history of other mental and behavioral disorders: Secondary | ICD-10-CM | POA: Insufficient documentation

## 2015-05-24 DIAGNOSIS — R319 Hematuria, unspecified: Secondary | ICD-10-CM | POA: Diagnosis present

## 2015-05-24 DIAGNOSIS — Y846 Urinary catheterization as the cause of abnormal reaction of the patient, or of later complication, without mention of misadventure at the time of the procedure: Secondary | ICD-10-CM | POA: Insufficient documentation

## 2015-05-24 HISTORY — DX: Unspecified injury at T7-t10 level of thoracic spinal cord, initial encounter: S24.103A

## 2015-05-24 HISTORY — DX: Reserved for concepts with insufficient information to code with codable children: IMO0002

## 2015-05-24 HISTORY — DX: Neurogenic bowel, not elsewhere classified: K59.2

## 2015-05-24 NOTE — ED Notes (Signed)
He had a new nurse in the urologist office replace his foley catheter today. Pt states she inflated the balloon in the uretra before putting it inside the bladder. He is now having bladder spasms and hematuria.

## 2015-05-24 NOTE — ED Notes (Signed)
Foley flushed with NS 120 mL, no difficulties with flushing or drainage, no clots expelled, clear yellow urine draining. MD notified.

## 2015-05-24 NOTE — ED Provider Notes (Signed)
CSN: [id number]     Arrival date & time 05/24/15  1735 History  This chart was scribed for Pricilla Loveless, MD by Tanda Rockers, ED Scribe. This patient was seen in room MH01/MH01 and the patient's care was started at 6:24 PM.  No chief complaint on file.  The history is provided by the patient. No language interpreter was used.     HPI Comments: Juan Blake is a 24 y.o. 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 Pt had foley catheter placed today by a brand new nurse. Pt states that the nurse blew up the balloon in the urethra. He states that there was blood immediately following this. Nurse deflated the balloon and placed it in correctly. He notes that there is now urine leaking out of the catheter now. Pt states he has only had 50 CCs of urine come out of the catheter which is much lower than normal. He denies any pain to the area. Pt also complains of mild penile swelling. Denies any other symptoms.   Past Medical History  Diagnosis Date  . Testicular torsion   . Neurogenic bladder disorder   . PTSD (post-traumatic stress disorder)   . Concussion   . Spinal cord injury of T10 vertebra   . Spastic paraplegia   . Neurogenic bowel    Past Surgical History  Procedure Laterality Date  . Testicle surgery    . Foot surgery    . Knee surgery    . Neck surgery    . Tympanostomy tube placement    . Hip surgery    . Posterior fusion cervical spine    . Calcaneal osteotomy w/ internal fixation    . Gastrocnemius recession    . Osteotomy tarsal     No family history on file. Social History  Substance Use Topics  . Smoking status: Never Smoker   . Smokeless tobacco: None  . Alcohol Use: No    Review of Systems  Genitourinary: Positive for hematuria and penile swelling.  All other systems reviewed and are negative.  Allergies  Bupropion; Duloxetine hcl;  Citalopram; Nabumetone; Nitrofurantoin monohyd macro; and Tizanidine  Home Medications   Prior to Admission medications   Medication Sig Start Date End Date Taking? Authorizing Provider  atomoxetine (STRATTERA) 25 MG capsule Take 25 mg by mouth daily. 04/12/15 04/11/16  Historical Provider, MD  baclofen (LIORESAL) 20 MG tablet Take 20 mg by mouth 4 (four) times daily.    Historical Provider, MD  bisacodyl (DULCOLAX) 10 MG suppository Place 10 mg rectally as needed for mild constipation or moderate constipation.    Historical Provider, MD   Triage Vitals: BP 151/90 mmHg  Pulse 107  Temp(Src) 98.2 F (36.8 C) (Oral)  Resp 16  Wt 200 lb (90.719 kg)  SpO2 100%   Physical Exam  Constitutional: He is oriented to person, place, and time. He appears well-developed and well-nourished.  HENT:  Head: Normocephalic and atraumatic.  Right Ear: External ear normal.  Left Ear: External ear normal.  Nose: Nose normal.  Eyes: Right eye exhibits no discharge. Left eye exhibits no discharge.  Neck: Neck supple.  Cardiovascular: Normal rate, regular rhythm, normal heart sounds and intact distal pulses.   Pulmonary/Chest: Effort normal and breath sounds normal.  Abdominal: Soft. There is no tenderness.  Genitourinary:  Foley catheter in place  No blood or urine surrounding the catheter No swelling of  penis, scrotum or groin noted  Musculoskeletal: He exhibits no edema.  Neurological: He is alert and oriented to person, place, and time.  Skin: Skin is warm and dry.  Nursing note and vitals reviewed.   ED Course  Procedures (including critical care time)  DIAGNOSTIC STUDIES: Oxygen Saturation is 100% on RA, normal by my interpretation.    COORDINATION OF CARE: 6:45 PM-Discussed treatment plan which includes consult with urology, Dr. Logan Bores, with pt at bedside and pt agreed to plan.   Labs Review Labs Reviewed - No data to display  Imaging Review No results found. I have personally  reviewed and evaluated these images and lab results as part of my medical decision-making.   EKG Interpretation None      MDM   Final diagnoses:  Foley catheter problem, initial encounter    Patient's full catheter appears to be working adequately. No surrounding blood or leaking urine that I can see. His bladder is decompressed on bedside ultrasound. No hematuria at this time in foley bag. Discussed with urology on call at [facility] who recommends continued fully catheter management until they do his suprapubic catheter placement in the next couple weeks. Call their office as needed for further issues. Discussed with patient who agrees with plan.   I personally performed the services described in this documentation, which was scribed in my presence. The recorded information has been reviewed and is accurate.   Pricilla Loveless, MD 05/24/15 2022

## 2015-05-26 ENCOUNTER — Emergency Department (HOSPITAL_COMMUNITY)
Admission: EM | Admit: 2015-05-26 | Discharge: 2015-05-26 | Disposition: A | Discharge status: Home / Self Care | Payer: BC Managed Care – PPO | Attending: Emergency Medicine | Admitting: Emergency Medicine

## 2015-05-26 ENCOUNTER — Encounter (HOSPITAL_COMMUNITY): Payer: Self-pay

## 2015-05-26 DIAGNOSIS — Z87438 Personal history of other diseases of male genital organs: Secondary | ICD-10-CM | POA: Insufficient documentation

## 2015-05-26 DIAGNOSIS — R319 Hematuria, unspecified: Secondary | ICD-10-CM | POA: Diagnosis present

## 2015-05-26 DIAGNOSIS — Z8659 Personal history of other mental and behavioral disorders: Secondary | ICD-10-CM | POA: Insufficient documentation

## 2015-05-26 DIAGNOSIS — Z79899 Other long term (current) drug therapy: Secondary | ICD-10-CM | POA: Insufficient documentation

## 2015-05-26 DIAGNOSIS — Z8669 Personal history of other diseases of the nervous system and sense organs: Secondary | ICD-10-CM | POA: Diagnosis not present

## 2015-05-26 DIAGNOSIS — Z87828 Personal history of other (healed) physical injury and trauma: Secondary | ICD-10-CM | POA: Insufficient documentation

## 2015-05-26 DIAGNOSIS — Z8719 Personal history of other diseases of the digestive system: Secondary | ICD-10-CM | POA: Diagnosis not present

## 2015-05-26 LAB — URINALYSIS, ROUTINE W REFLEX MICROSCOPIC
BILIRUBIN URINE: NEGATIVE
Glucose, UA: 250 mg/dL — AB
HGB URINE DIPSTICK: NEGATIVE
Ketones, ur: NEGATIVE mg/dL
Leukocytes, UA: NEGATIVE
NITRITE: NEGATIVE
PROTEIN: 30 mg/dL — AB
SPECIFIC GRAVITY, URINE: 1.016 (ref 1.005–1.030)
UROBILINOGEN UA: 1 mg/dL (ref 0.0–1.0)
pH: 6.5 (ref 5.0–8.0)

## 2015-05-26 LAB — URINE MICROSCOPIC-ADD ON

## 2015-05-26 NOTE — ED Notes (Signed)
Patient thinks he placed an In and out cath in wrong. Patient has bright red bleeding from his urethra. Patient unable to determine pain.

## 2015-05-26 NOTE — ED Provider Notes (Signed)
CSN: [id number]     Arrival date & time 05/26/15  1539 History   First MD Initiated Contact with Patient 05/26/15 1647     Chief Complaint  Patient presents with  . bleeding from urethra      (Consider location/radiation/quality/duration/timing/severity/associated sxs/prior Treatment) The history is provided by the patient. No language interpreter was used.  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 He was able to get urine out but was concerned that he may be made a secondary opening to the urethra.  He called his physician, Dr. Logan Bores, at [facility] who told him to come to the ED. He has been self cathing for the past 2 days after having his foley removed due to leaking. He denies any pain secondary to loss of sensation below the thoracic cavity. Nothing makes his symptoms better or worse. He denies any other symptoms.   Past Medical History  Diagnosis Date  . Testicular torsion   . Neurogenic bladder disorder   . PTSD (post-traumatic stress disorder)   . Concussion   . Spinal cord injury of T10 vertebra   . Spastic paraplegia   . Neurogenic bowel    Past Surgical History  Procedure Laterality Date  . Testicle surgery    . Foot surgery    . Knee surgery    . Neck surgery    . Tympanostomy tube placement    . Hip surgery    . Posterior fusion cervical spine    . Calcaneal osteotomy w/ internal fixation    . Gastrocnemius recession    . Osteotomy tarsal     History reviewed. No pertinent family history. Social History  Substance Use Topics  . Smoking status: Never Smoker   . Smokeless tobacco: Never Used  . Alcohol Use: No    Review of Systems  Genitourinary: Positive for hematuria. Negative for penile swelling and scrotal swelling.  All other systems reviewed and are negative.     Allergies  Bupropion; Duloxetine hcl; Citalopram; Linzess;  Nabumetone; Nitrofurantoin monohyd macro; and Tizanidine  Home Medications   Prior to Admission medications   Medication Sig Start Date End Date Taking? Authorizing Provider  atomoxetine (STRATTERA) 25 MG capsule Take 25 mg by mouth daily. 04/12/15 04/11/16 Yes Historical Provider, MD  baclofen (LIORESAL) 20 MG tablet Take 20 mg by mouth 4 (four) times daily.   Yes Historical Provider, MD  bisacodyl (DULCOLAX) 10 MG suppository Place 10 mg rectally as needed for mild constipation or moderate constipation.   Yes Historical Provider, MD  HYDROcodone-acetaminophen (NORCO/VICODIN) 5-325 MG per tablet Take 1 tablet by mouth daily as needed. 09/24/14  Yes Historical Provider, MD  ondansetron (ZOFRAN) 4 MG tablet Take 4 mg by mouth daily as needed. 05/14/15 05/13/16 Yes Historical Provider, MD   BP 141/81 mmHg  Pulse 89  Temp(Src) 97 F (36.1 C) (Oral)  Resp 16  Ht 5\' 7"  (1.702 m)  Wt 200 lb (90.719 kg)  BMI 31.32 kg/m2  SpO2 96% Physical Exam  Constitutional: He is oriented to person, place, and time. He appears well-developed and well-nourished.  HENT:  Head: Normocephalic and atraumatic.  Eyes: Conjunctivae are normal.  Neck: Normal range of motion.  Cardiovascular: Normal rate, regular rhythm and normal heart sounds.   Pulmonary/Chest: Effort normal and breath sounds normal. No respiratory distress.  Abdominal: Soft. He exhibits no distension. There is  no tenderness. There is no rebound and no guarding.  Musculoskeletal:  Wheelchair bound.  Unable to ambulate.  Neurological: He is alert and oriented to person, place, and time.  Skin: Skin is warm and dry.  Psychiatric: He has a normal mood and affect.  Nursing note and vitals reviewed.   ED Course  Procedures (including critical care time) Labs Review Labs Reviewed  URINALYSIS, ROUTINE W REFLEX MICROSCOPIC (NOT AT [facility]) - Abnormal; Notable for the following:    Glucose, UA 250 (*)    Protein, ur 30 (*)    All other components  within normal limits  URINE MICROSCOPIC-ADD ON    Imaging Review No results found. I have personally reviewed and evaluated these images and lab results as part of my medical decision-making.   EKG Interpretation None      MDM   Final diagnoses:  Hematuria  Patient presents for hematuria when trying to self cath.  He was scared that he may have caused a secondary opening since he has no sensation below the waist.  His urine was not concerning for UTI or hematuria. The nurse was able to in and out cath the patient without difficulty and urine was drained. He has no blood clots or hematuria.  I discussed return precautions and follow up, as well as follow up with his urologist at [facility]. He agrees with the plan.    Catha Gosselin, PA-C 05/26/15 2158  Mancel Bale, MD 05/26/15 [id number]

## 2015-05-26 NOTE — ED Notes (Signed)
I&O done.  No bleeding or resistance noted.  Pt's urine appeared yellow and cloudy.

## 2015-05-26 NOTE — ED Notes (Addendum)
Per pt: pt is paraplegic.  Noticed bleeding from urethra this afternoon after self-cathertization attempt.  Pt states catheretization has been more difficult over last couple weeks.  Intake has been minimal d/t be scared to self cath.  Denies fever or pain.  Does have some dizziness that started yesterday 05/25/15.

## 2015-05-26 NOTE — Discharge Instructions (Signed)
Hematuria Follow up with your urologist at [facility].  Return for inability to self cath. Hematuria is blood in your urine. It can be caused by a bladder infection, kidney infection, prostate infection, kidney stone, or cancer of your urinary tract. Infections can usually be treated with medicine, and a kidney stone usually will pass through your urine. If neither of these is the cause of your hematuria, further workup to find out the reason may be needed. It is very important that you tell your health care provider about any blood you see in your urine, even if the blood stops without treatment or happens without causing pain. Blood in your urine that happens and then stops and then happens again can be a symptom of a very serious condition. Also, pain is not a symptom in the initial stages of many urinary cancers. HOME CARE INSTRUCTIONS   Drink lots of fluid, 3-4 quarts a day. If you have been diagnosed with an infection, cranberry juice is especially recommended, in addition to large amounts of water.  Avoid caffeine, tea, and carbonated beverages because they tend to irritate the bladder.  Avoid alcohol because it may irritate the prostate.  Take all medicines as directed by your health care provider.  If you were prescribed an antibiotic medicine, finish it all even if you start to feel better.  If you have been diagnosed with a kidney stone, follow your health care provider's instructions regarding straining your urine to catch the stone.  Empty your bladder often. Avoid holding urine for long periods of time.  After a bowel movement, women should cleanse front to back. Use each tissue only once.  Empty your bladder before and after sexual intercourse if you are a male. SEEK MEDICAL CARE IF:  You develop back pain.  You have a fever.  You have a feeling of sickness in your stomach (nausea) or vomiting.  Your symptoms are not better in 3 days. Return sooner if you are getting  worse. SEEK IMMEDIATE MEDICAL CARE IF:   You develop severe vomiting and are unable to keep the medicine down.  You develop severe back or abdominal pain despite taking your medicines.  You begin passing a large amount of blood or clots in your urine.  You feel extremely weak or faint, or you pass out. MAKE SURE YOU:   Understand these instructions.  Will watch your condition.  Will get help right away if you are not doing well or get worse. Document Released: 09/11/2005 Document Revised: 01/26/2014 Document Reviewed: 05/12/2013 [facility] Patient Information 2015 [place], [place]. This information is not intended to replace advice given to you by your health care provider. Make sure you discuss any questions you have with your health care provider.
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
## 2015-07-14 ENCOUNTER — Observation Stay (HOSPITAL_COMMUNITY)
Admission: EM | Admit: 2015-07-14 | Discharge: 2015-07-14 | Disposition: A | Discharge status: Home / Self Care | Payer: BC Managed Care – PPO | Attending: Internal Medicine | Admitting: Internal Medicine

## 2015-07-14 ENCOUNTER — Encounter (HOSPITAL_COMMUNITY): Payer: Self-pay

## 2015-07-14 DIAGNOSIS — N4830 Priapism, unspecified: Secondary | ICD-10-CM | POA: Diagnosis present

## 2015-07-14 DIAGNOSIS — Z79891 Long term (current) use of opiate analgesic: Secondary | ICD-10-CM | POA: Insufficient documentation

## 2015-07-14 DIAGNOSIS — Z79899 Other long term (current) drug therapy: Secondary | ICD-10-CM | POA: Insufficient documentation

## 2015-07-14 DIAGNOSIS — F29 Unspecified psychosis not due to a substance or known physiological condition: Secondary | ICD-10-CM | POA: Diagnosis present

## 2015-07-14 DIAGNOSIS — F447 Conversion disorder with mixed symptom presentation: Secondary | ICD-10-CM | POA: Diagnosis present

## 2015-07-14 MED ORDER — TERBUTALINE SULFATE 5 MG PO TABS
5.0000 mg | ORAL_TABLET | Freq: Four times a day (QID) | ORAL | Status: DC
  Administered 2015-07-14: 5 mg via ORAL
  Filled 2015-07-14 (×3): qty 1

## 2015-07-14 MED ORDER — LIDOCAINE HCL (PF) 1 % IJ SOLN
5.0000 mL | Freq: Once | INTRAMUSCULAR | Status: AC
  Administered 2015-07-14: 5 mL

## 2015-07-14 MED ORDER — SITAGLIPTIN PHOS-METFORMIN HCL 50-500 MG PO TABS: 1.0000 | ORAL_TABLET | Freq: Two times a day (BID) | ORAL | Status: DC

## 2015-07-14 MED ORDER — HYDROCODONE-ACETAMINOPHEN 5-325 MG PO TABS: 1.0000 | ORAL_TABLET | ORAL | Status: DC | PRN

## 2015-07-14 MED ORDER — ATOMOXETINE HCL 25 MG PO CAPS: 25.0000 mg | ORAL_CAPSULE | Freq: Every day | ORAL | Status: DC

## 2015-07-14 MED ORDER — LIDOCAINE HCL (PF) 1 % IJ SOLN
INTRAMUSCULAR | Status: AC
  Filled 2015-07-14: qty 5

## 2015-07-14 MED ORDER — TERBUTALINE SULFATE 5 MG PO TABS: 5.0000 mg | ORAL_TABLET | Freq: Three times a day (TID) | ORAL | Status: DC

## 2015-07-14 MED ORDER — BACLOFEN 20 MG PO TABS
20.0000 mg | ORAL_TABLET | Freq: Every day | ORAL | Status: DC
  Filled 2015-07-14 (×4): qty 1

## 2015-07-14 MED ORDER — BISACODYL 10 MG RE SUPP: 10.0000 mg | RECTAL | Status: DC | PRN

## 2015-07-14 MED ORDER — PHENYLEPHRINE 200 MCG/ML FOR PRIAPISM / HYPOTENSION
50.0000 ug | Freq: Once | INTRAMUSCULAR | Status: AC
  Administered 2015-07-14: 50 ug via INTRACAVERNOUS
  Filled 2015-07-14: qty 50

## 2015-07-14 MED ORDER — ENOXAPARIN SODIUM 40 MG/0.4ML ~~LOC~~ SOLN: 40.0000 mg | SUBCUTANEOUS | Status: DC
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
## 2015-07-14 NOTE — ED Notes (Signed)
Dr. Donnald GarrePfeiffer at bedside injecting Phenylephrine. This RN at bedside assisting. Patient tolerated well.

## 2015-07-14 NOTE — ED Notes (Signed)
Priapism tray placed @ BS with 1% lidocaine.

## 2015-07-14 NOTE — Discharge Instructions (Signed)
Priapism °Priapism is an unwanted erection of the penis that usually develops without sexual stimulation or desire. Priapism affects males of all ages. There are three types of priapism: °· Recurrent acute priapism. With this type, erections are painful and last less than 3 hours. The erections come and go. °· Acute prolonged priapism. With this type, erections are painful and last hours to days. This type can lead to erectile dysfunction. °· Persistent priapism. With this type, erections are usually painless and can last weeks to years. The penis gets erect but not rigid. This type can lead to erectile dysfunction. °CAUSES °This condition develops either when blood has difficulty leaving the penis (low-flow priapism) or if too much blood flows into the penis (high-flow priapism). Blood flow issues may be caused by: °· Erectile dysfunction medicine. This is the most common cause. °· Medicine that is used to treat depression and anxiety. °· Blood problems that are common in people who have sickle cell disease or leukemia. °· Use of illegal drugs, such as cocaine and marijuana. °· Excessive alcohol use. °· Neurological problems, such as multiple sclerosis. °· Diabetes mellitus. °· An injury to the penis. °· An infection. °In some cases, the cause may not be known. °SYMPTOMS °Symptoms of this condition include: °· A prolonged erection. °· A painful erection. °DIAGNOSIS °This condition is diagnosed with a physical exam. Blood tests may be done to help identify the cause of the condition. °TREATMENT °Treatment for this condition depends on the cause and the type of priapism. Recurrent acute priapism is often managed at home. Acute prolonged priapism is usually treated at a hospital, where treatment may involve: °· Getting fluid and medicines for pain through an IV tube. °· A blood transfusion. °· A procedure to drain blood from the penis. °· Surgery to make a passageway for blood to flow in the penis (surgical  shunting). °No standard treatment exists for persistent priapism. °HOME CARE INSTRUCTIONS °Managing Recurrent Priapism °· Try taking a warm bath or exercising. °· Keep track of how long your erection lasts. If it does not go away in 3 hours, seek medical care. °General Instructions °· Avoid sexual stimulation and intercourse until your health care provider says that they are okay for you. °· Avoid drugs or alcohol if they caused the priapism. Avoiding them can help to prevent the condition from coming back. °· Drink enough fluid to keep your urine clear or pale yellow. °· Empty your bladder as much as possible. °· Take over-the-counter and prescription medicines only as told by your health care provider. °· Do not take any medicines during an episode unless you get approval from your health care provider. °· Keep all follow-up visits as told by your health care provider. This is important. °SEEK MEDICAL CARE IF: °· Your pain gets worse. °· Your pain does not improve with treatment. °· You have recurrent priapism and your erection does not go away in 3 hours. °SEEK IMMEDIATE MEDICAL CARE IF: °· You have a fever or chills. °· You have pain, swelling, or redness in your genitals or your groin area. °  °This information is not intended to replace advice given to you by your health care provider. Make sure you discuss any questions you have with your health care provider. °  °Document Released: 12/02/2003 Document Revised: 06/02/2015 Document Reviewed: 12/15/2014 °Elsevier Interactive Patient Education ©2016 [employer] ° °

## 2015-07-14 NOTE — ED Provider Notes (Signed)
CSN: [id number]     Arrival date & time 07/14/15  1234 History   First MD Initiated Contact with Patient 07/14/15 1313     Chief Complaint  Patient presents with  . priapism      (Consider location/radiation/quality/duration/timing/severity/associated sxs/prior Treatment) HPI Patient reports that he has had a recurrent erection over the past 24 hours. He was treated at [facility] yesterday evening and reports he got "70% relief of an erection." He reports that when he woke up this morning the erection was back again. He states he's never had this problem. The most recent has gone on since 6 AM. Patient is history spinal cord injury. Past Medical History  Diagnosis Date  . Testicular torsion   . Neurogenic bladder disorder   . PTSD (post-traumatic stress disorder)   . Concussion   . Spinal cord injury of T10 vertebra (HCC)   . Spastic paraplegia (HCC)   . Neurogenic bowel    Past Surgical History  Procedure Laterality Date  . Testicle surgery    . Foot surgery    . Knee surgery    . Neck surgery    . Tympanostomy tube placement    . Hip surgery    . Posterior fusion cervical spine    . Calcaneal osteotomy w/ internal fixation    . Gastrocnemius recession    . Osteotomy tarsal     History reviewed. No pertinent family history. Social History  Substance Use Topics  . Smoking status: Never Smoker   . Smokeless tobacco: Never Used  . Alcohol Use: No    Review of Systems 10 Systems reviewed and are negative for acute change except as noted in the HPI.    Allergies  Bupropion; Duloxetine hcl; Citalopram; Linzess; Nabumetone; Nitrofurantoin monohyd macro; and Tizanidine  Home Medications   Prior to Admission medications   Medication Sig Start Date End Date Taking? Authorizing Provider  atomoxetine (STRATTERA) 25 MG capsule Take 25 mg by mouth daily. 04/12/15 04/11/16 Yes Historical Provider, MD  baclofen (LIORESAL) 20 MG tablet Take 20 mg by mouth 5 (five)  times daily.    Yes Historical Provider, MD  bisacodyl (DULCOLAX) 10 MG suppository Place 10 mg rectally as needed for mild constipation or moderate constipation.   Yes Historical Provider, MD  HYDROcodone-acetaminophen (NORCO/VICODIN) 5-325 MG per tablet Take 1 tablet by mouth every 4 (four) hours as needed for moderate pain.  09/24/14  Yes Historical Provider, MD  ondansetron (ZOFRAN) 4 MG tablet Take 4 mg by mouth daily as needed. 05/14/15 05/13/16 Yes Historical Provider, MD  sitaGLIPtin-metformin (JANUMET) 50-500 MG tablet Take 1 tablet by mouth 2 (two) times daily. 07/09/15 07/08/16 Yes Historical Provider, MD  terbutaline (BRETHINE) 5 MG tablet Take 1 tablet (5 mg total) by mouth 3 (three) times daily. Take 3 times daily for the next 24 hours. Then take as needed. 07/14/15   Arby BarretteMarcy Ravynn Hogate, MD   BP 140/84 mmHg  Pulse 96  Temp(Src) 98.2 F (36.8 C) (Oral)  Resp 16  SpO2 99% Physical Exam  Constitutional: He is oriented to person, place, and time. He appears well-developed and well-nourished.  HENT:  Head: Normocephalic and atraumatic.  Eyes: Pupils are equal, round, and reactive to light.  Neck: Neck supple.  Pulmonary/Chest: Effort normal.  Abdominal: Soft. Bowel sounds are normal. He exhibits no distension. There is no tenderness.  Genitourinary:  Penis is erect. Moderate turgidity.  Neurological: He is alert and oriented to person, place, and time. He has normal  strength. He exhibits abnormal muscle tone. GCS eye subscore is 4. GCS verbal subscore is 5. GCS motor subscore is 6.  Patient spinal cord injury very limited use of his lower extremities.  Skin: Skin is warm, dry and intact.  Psychiatric: He has a normal mood and affect.    ED Course  Procedures (including critical care time) PRIAPISM TREATMENT (injection) Patient was prepped in standard sterile fashion 1% lidocaine injected at the base of the penis on left and right sides. Blood was aspirated With butterfly needle.   1 cc of 200 mcg/ml phenylephrine was injected after aspiration to each side. The patient tolerated the procedure No complications, patient had approximately 75% decrease in erection.  Priapism recurred and procedure was repeated with complete resolution.  Labs Review Labs Reviewed - No data to display  Imaging Review No results found.   EKG Interpretation None     Consult: Patient's case was reviewed Dr. Janifer Adie. He suggested the use of terbutaline 5 mg every 8-12 hours may be helpful for the patient's priapism. MDM   Final diagnoses:  Priapism       Arby Barrette, MD 07/14/15 1736

## 2015-07-14 NOTE — ED Notes (Signed)
[facility] to see if he could have kids. Patient states he was injected and after 4 hours the erection did not go away. [facility] and was given an injection, but when he woke this AM he had an erection.and has not gone away. Patient states he is unable to feel pain.

## 2015-12-02 ENCOUNTER — Emergency Department (HOSPITAL_BASED_OUTPATIENT_CLINIC_OR_DEPARTMENT_OTHER)
Admission: EM | Admit: 2015-12-02 | Discharge: 2015-12-02 | Discharge status: Against Medical Advice | Payer: BC Managed Care – PPO | Attending: Emergency Medicine | Admitting: Emergency Medicine

## 2015-12-02 ENCOUNTER — Encounter (HOSPITAL_BASED_OUTPATIENT_CLINIC_OR_DEPARTMENT_OTHER): Payer: Self-pay | Admitting: *Deleted

## 2015-12-02 DIAGNOSIS — N4830 Priapism, unspecified: Secondary | ICD-10-CM

## 2015-12-02 DIAGNOSIS — N4889 Other specified disorders of penis: Secondary | ICD-10-CM | POA: Diagnosis present

## 2015-12-02 DIAGNOSIS — Z79899 Other long term (current) drug therapy: Secondary | ICD-10-CM | POA: Insufficient documentation

## 2015-12-02 DIAGNOSIS — Z9889 Other specified postprocedural states: Secondary | ICD-10-CM | POA: Diagnosis not present

## 2015-12-02 DIAGNOSIS — G8220 Paraplegia, unspecified: Secondary | ICD-10-CM | POA: Diagnosis not present

## 2015-12-02 DIAGNOSIS — Z87828 Personal history of other (healed) physical injury and trauma: Secondary | ICD-10-CM | POA: Diagnosis not present

## 2015-12-02 DIAGNOSIS — Z8659 Personal history of other mental and behavioral disorders: Secondary | ICD-10-CM | POA: Insufficient documentation

## 2015-12-02 DIAGNOSIS — Z8719 Personal history of other diseases of the digestive system: Secondary | ICD-10-CM | POA: Diagnosis not present

## 2015-12-02 DIAGNOSIS — Z7984 Long term (current) use of oral hypoglycemic drugs: Secondary | ICD-10-CM | POA: Insufficient documentation

## 2015-12-02 LAB — CBC WITH DIFFERENTIAL/PLATELET
Basophils Absolute: 0.1 10*3/uL (ref 0.0–0.1)
Basophils Relative: 1 %
Eosinophils Absolute: 0.1 10*3/uL (ref 0.0–0.7)
Eosinophils Relative: 2 %
HEMATOCRIT: 43 % (ref 39.0–52.0)
Hemoglobin: 15.1 g/dL (ref 13.0–17.0)
LYMPHS PCT: 27 %
Lymphs Abs: 2.1 10*3/uL (ref 0.7–4.0)
MCH: 29.3 pg (ref 26.0–34.0)
MCHC: 35.1 g/dL (ref 30.0–36.0)
MCV: 83.5 fL (ref 78.0–100.0)
MONO ABS: 0.6 10*3/uL (ref 0.1–1.0)
MONOS PCT: 7 %
NEUTROS ABS: 5 10*3/uL (ref 1.7–7.7)
Neutrophils Relative %: 63 %
Platelets: 166 10*3/uL (ref 150–400)
RBC: 5.15 MIL/uL (ref 4.22–5.81)
RDW: 13.1 % (ref 11.5–15.5)
WBC: 7.9 10*3/uL (ref 4.0–10.5)

## 2015-12-02 LAB — BASIC METABOLIC PANEL
ANION GAP: 10 (ref 5–15)
BUN: 16 mg/dL (ref 6–20)
CALCIUM: 9 mg/dL (ref 8.9–10.3)
CO2: 26 mmol/L (ref 22–32)
CREATININE: 0.57 mg/dL — AB (ref 0.61–1.24)
Chloride: 103 mmol/L (ref 101–111)
GFR calc Af Amer: >60 mL/min (ref >60)
GFR calc non Af Amer: >60 mL/min (ref >60)
GLUCOSE: 131 mg/dL — AB (ref 65–99)
Potassium: 3.8 mmol/L (ref 3.5–5.1)
Sodium: 139 mmol/L (ref 135–145)

## 2015-12-02 MED ORDER — PHENYLEPHRINE 200 MCG/ML FOR PRIAPISM / HYPOTENSION: 100.0000 ug | Freq: Once | INTRAMUSCULAR | Status: DC

## 2015-12-02 MED ORDER — TERBUTALINE SULFATE 1 MG/ML IJ SOLN
0.2500 mg | Freq: Once | INTRAMUSCULAR | Status: AC
  Administered 2015-12-02: 0.25 mg via SUBCUTANEOUS
  Filled 2015-12-02: qty 1

## 2015-12-02 NOTE — ED Notes (Signed)
Bladder scan performed x 3, 0 ml indicated, suprapubic area palpated, soft to palpation

## 2015-12-02 NOTE — ED Notes (Signed)
Bleeding from his penis since having a urinary procedure this am. He self caths and has not been able to insert the cath since 11am.

## 2015-12-02 NOTE — Discharge Instructions (Signed)
Priapism °Priapism is an unwanted erection of the penis that usually develops without sexual stimulation or desire. Priapism affects males of all ages. There are three types of priapism: °· Recurrent acute priapism. With this type, erections are painful and last less than 3 hours. The erections come and go. °· Acute prolonged priapism. With this type, erections are painful and last hours to days. This type can lead to erectile dysfunction. °· Persistent priapism. With this type, erections are usually painless and can last weeks to years. The penis gets erect but not rigid. This type can lead to erectile dysfunction. °CAUSES °This condition develops either when blood has difficulty leaving the penis (low-flow priapism) or if too much blood flows into the penis (high-flow priapism). Blood flow issues may be caused by: °· Erectile dysfunction medicine. This is the most common cause. °· Medicine that is used to treat depression and anxiety. °· Blood problems that are common in people who have sickle cell disease or leukemia. °· Use of illegal drugs, such as cocaine and marijuana. °· Excessive alcohol use. °· Neurological problems, such as multiple sclerosis. °· Diabetes mellitus. °· An injury to the penis. °· An infection. °In some cases, the cause may not be known. °SYMPTOMS °Symptoms of this condition include: °· A prolonged erection. °· A painful erection. °DIAGNOSIS °This condition is diagnosed with a physical exam. Blood tests may be done to help identify the cause of the condition. °TREATMENT °Treatment for this condition depends on the cause and the type of priapism. Recurrent acute priapism is often managed at home. Acute prolonged priapism is usually treated at a hospital, where treatment may involve: °· Getting fluid and medicines for pain through an IV tube. °· A blood transfusion. °· A procedure to drain blood from the penis. °· Surgery to make a passageway for blood to flow in the penis (surgical  shunting). °No standard treatment exists for persistent priapism. °HOME CARE INSTRUCTIONS °Managing Recurrent Priapism °· Try taking a warm bath or exercising. °· Keep track of how long your erection lasts. If it does not go away in 3 hours, seek medical care. °General Instructions °· Avoid sexual stimulation and intercourse until your health care provider says that they are okay for you. °· Avoid drugs or alcohol if they caused the priapism. Avoiding them can help to prevent the condition from coming back. °· Drink enough fluid to keep your urine clear or pale yellow. °· Empty your bladder as much as possible. °· Take over-the-counter and prescription medicines only as told by your health care provider. °· Do not take any medicines during an episode unless you get approval from your health care provider. °· Keep all follow-up visits as told by your health care provider. This is important. °SEEK MEDICAL CARE IF: °· Your pain gets worse. °· Your pain does not improve with treatment. °· You have recurrent priapism and your erection does not go away in 3 hours. °SEEK IMMEDIATE MEDICAL CARE IF: °· You have a fever or chills. °· You have pain, swelling, or redness in your genitals or your groin area. °  °This information is not intended to replace advice given to you by your health care provider. Make sure you discuss any questions you have with your health care provider. °  °Document Released: 12/02/2003 Document Revised: 06/02/2015 Document Reviewed: 12/15/2014 °Elsevier Interactive Patient Education ©2016 [employer] ° °

## 2015-12-02 NOTE — ED Notes (Signed)
Presents today having difficulty with self-catheterizations. Then noted today also that had bleeding from the urethra, states the amt of blood was the size of his palm, bright red in color.

## 2015-12-02 NOTE — ED Provider Notes (Signed)
CSN: [id number]     Arrival date & time 12/02/15  1700 History   First MD Initiated Contact with Patient 12/02/15 1720     Chief Complaint  Patient presents with  . Penile Bleeding    HPI Pt has heriditary spastic paraplegia. He has issues with bladder spasms and neurogenic bladder. Pt was having urodynamic bladder testing performed today.  This was done at [facility].  He felt like the test did not go well. Pt has been having issues since the procedure.  He has had some intermittent penile bleeding and has been having difficulty self catheterizing.  His urologist is at [facility].  No fever or vomiting. Pt also has history of priapism.  He has had an erection now since the urodynamic testing which was maybe 6 hours ago.  He has had this problem in the past and required phenylephrine injection. Past Medical History  Diagnosis Date  . Testicular torsion   . Neurogenic bladder disorder   . PTSD (post-traumatic stress disorder)   . Concussion   . Spinal cord injury of T10 vertebra (HCC)   . Spastic paraplegia (HCC)   . Neurogenic bowel    Past Surgical History  Procedure Laterality Date  . Testicle surgery    . Foot surgery    . Knee surgery    . Neck surgery    . Tympanostomy tube placement    . Hip surgery    . Posterior fusion cervical spine    . Calcaneal osteotomy w/ internal fixation    . Gastrocnemius recession    . Osteotomy tarsal     No family history on file. Social History  Substance Use Topics  . Smoking status: Never Smoker   . Smokeless tobacco: Never Used  . Alcohol Use: No    Review of Systems  All other systems reviewed and are negative.     Allergies  Bupropion; Duloxetine hcl; Citalopram; Linzess; Nabumetone; Nitrofurantoin monohyd macro; and Tizanidine  Home Medications   Prior to Admission medications   Medication Sig Start Date End Date Taking? Authorizing Provider  baclofen (GABLOFEN) 10000 MCG/20ML SOLN 10,000 mcg by Intrathecal route  once.   Yes Historical Provider, MD  atomoxetine (STRATTERA) 25 MG capsule Take 25 mg by mouth daily. 04/12/15 04/11/16  Historical Provider, MD  baclofen (LIORESAL) 20 MG tablet Take 20 mg by mouth 5 (five) times daily.     Historical Provider, MD  bisacodyl (DULCOLAX) 10 MG suppository Place 10 mg rectally as needed for mild constipation or moderate constipation.    Historical Provider, MD  HYDROcodone-acetaminophen (NORCO/VICODIN) 5-325 MG per tablet Take 1 tablet by mouth every 4 (four) hours as needed for moderate pain.  09/24/14   Historical Provider, MD  ondansetron (ZOFRAN) 4 MG tablet Take 4 mg by mouth daily as needed. 05/14/15 05/13/16  Historical Provider, MD  sitaGLIPtin-metformin (JANUMET) 50-500 MG tablet Take 1 tablet by mouth 2 (two) times daily. 07/09/15 07/08/16  Historical Provider, MD  terbutaline (BRETHINE) 5 MG tablet Take 1 tablet (5 mg total) by mouth 3 (three) times daily. Take 3 times daily for the next 24 hours. Then take as needed. 07/14/15   Arby Barrette, MD   BP 138/78 mmHg  Pulse 104  Temp(Src) 98.3 F (36.8 C) (Oral)  Resp 18  Ht  (1.702 m)  Wt 92.534 kg  BMI 31.94 kg/m2  SpO2 98% Physical Exam  Constitutional: He appears well-developed and well-nourished. No distress.  HENT:  Head: Normocephalic and atraumatic.  Right Ear: External ear normal.  Left Ear: External ear normal.  Eyes: Conjunctivae are normal. Right eye exhibits no discharge. Left eye exhibits no discharge. No scleral icterus.  Neck: Neck supple. No tracheal deviation present.  Cardiovascular: Normal rate.   Pulmonary/Chest: Effort normal. No stridor. No respiratory distress.  Genitourinary: Testes normal. No hypospadias, penile erythema or penile tenderness. No discharge found.  Penile erection  Musculoskeletal: He exhibits no edema.  Neurological: He is alert. Cranial nerve deficit: no gross deficits.  paraplegia  Skin: Skin is warm and dry. No rash noted.  Psychiatric: He has a  normal mood and affect.  Nursing note and vitals reviewed.   ED Course  Procedures (including critical care time) Labs Review Labs Reviewed  BASIC METABOLIC PANEL - Abnormal; Notable for the following:    Glucose, Bld 131 (*)    Creatinine, Ser 0.57 (*)    All other components within normal limits  CBC WITH DIFFERENTIAL/PLATELET     MDM   Final diagnoses:  Priapism    Pt has priapism.  No bleeding noted on exam.  No urinary retention. This is contributing to his difficulty self catheterizing. Discussed doing a phenylephrine injection.  Pt would prefer an alternative first.  Will try terbutaline and if no response proceed with phenylephrine.  1910  Pt has a persistent erection.  Discussed that phenlyephrine would be the next best option.  I reviewed previous notes and he tolerated 200 mcg injection previously.  I discussed doing this with the patient and initially he agreed.  He called me back into the room and stated he would feel more comfortable with a urologist performing the procedure.  I explained to him that this is a free standing ED and there is no urologist.  [facility] and he would like to go there.    I [facility].  He would be happy to see the patient but we agreed that it would be better consider the time component to have me do the procedure here first.  I explained this to the patient.  He still does not want me to do the procedure and now would rather go to [facility] since he has seen urologists there as well.  Pt will leave AMA prior to having a phenylephrine injection.  He plans on going to [facility].  I did notify [place] ED that the patient may go there    Linwood DibblesJon Rayvn Rickerson, MD 12/02/15 [phone]

## 2016-01-10 IMAGING — CT CT HEAD W/O CM
3 of 5 series · 14 of 47 positions shown, 16 images · non-contrast
Comparison: Head CT 07/12/2014

CLINICAL [place]: [place]
from his wheel chair to a toilet. Pt does not rememeber [place], but does remember falling. Pt is normally wheel
chair bound, and has a hx of previous spinal injury, which has left
him with minimal movement in his legs. Pt vomited once after
falling. Pt also has hx of concussion. Pt reports dizziness,
nauseous and sleepy.

EXAM:
CT HEAD WITHOUT CONTRAST
CT CERVICAL SPINE WITHOUT CONTRAST
TECHNIQUE: Multidetector CT imaging of the head and cervical spine was
performed following the standard protocol without intravenous
contrast. Multiplanar CT image reconstructions of the cervical spine
were also generated.

[Series 8: coronals · coronal · 0.23mm/px · 3 of 48 slices shown]
[im 16/48  brain]
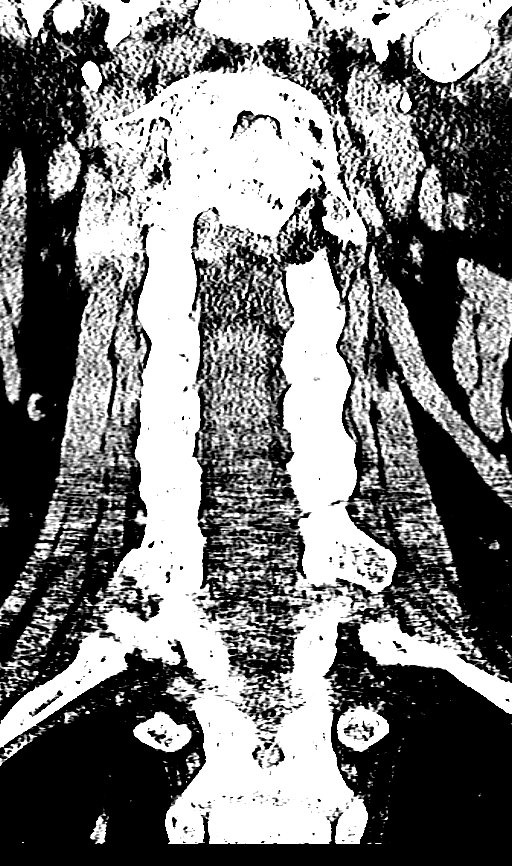
[im 21/48  brain]
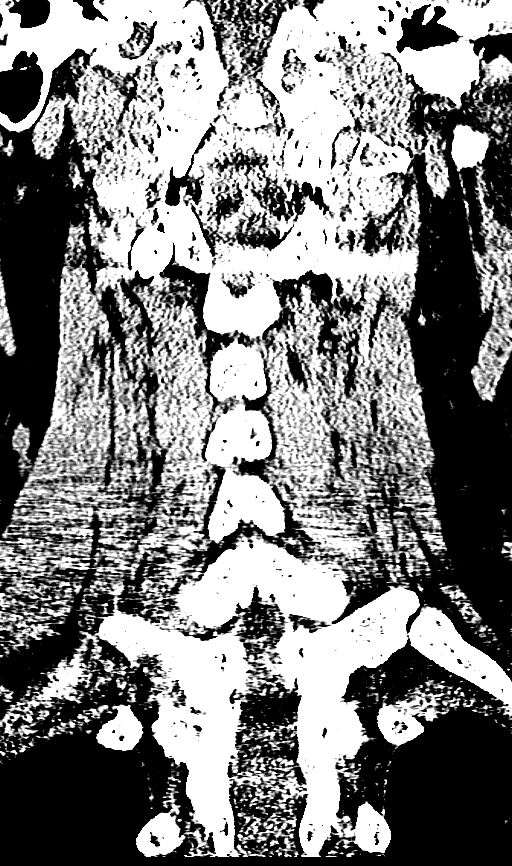
[im 27/48  brain]
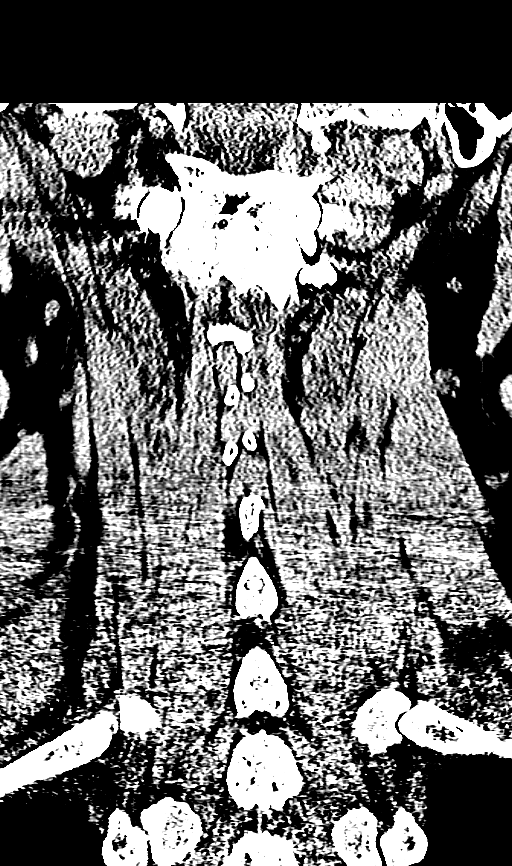

[Series 9: sagittals · sagittal · 0.28mm/px · 3 of 50 slices shown]
[im 17/50  brain]
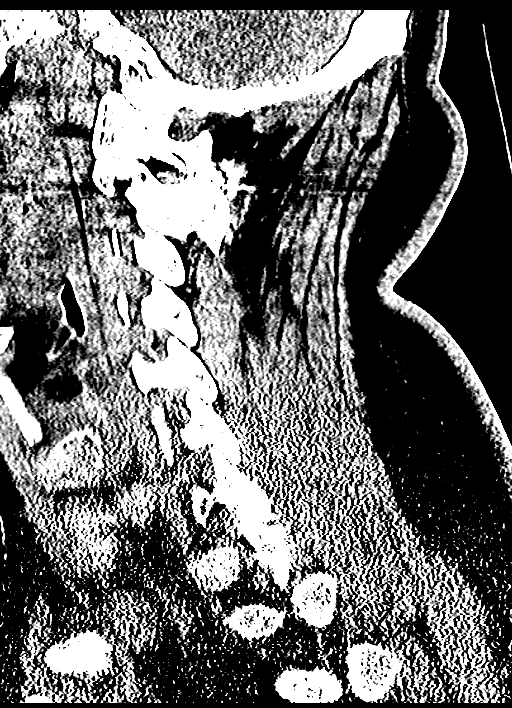
[im 25/50  brain]
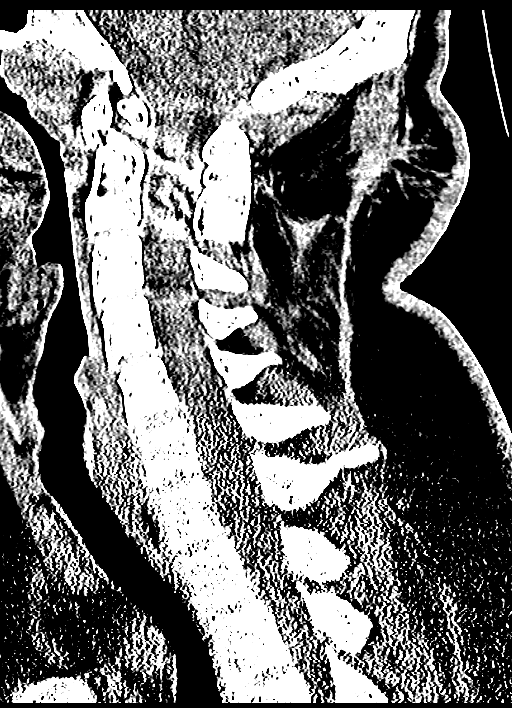
[im 33/50  brain]
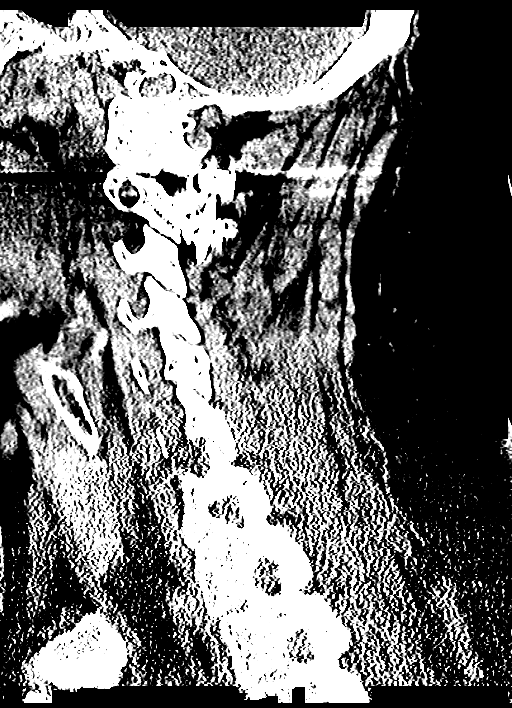

[Series 10: orthogonals · axial · 0.21mm/px · z∈[-305,-148]mm · 8 of 117 slices shown, 10 images]
[im 9/117  brain]
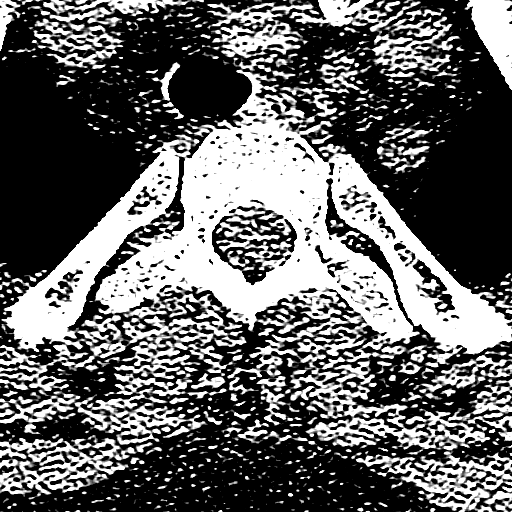
[im 9/117  bone]
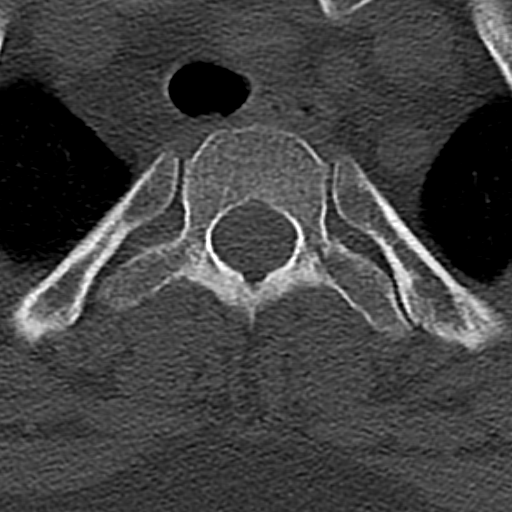
[im 27/117  brain]
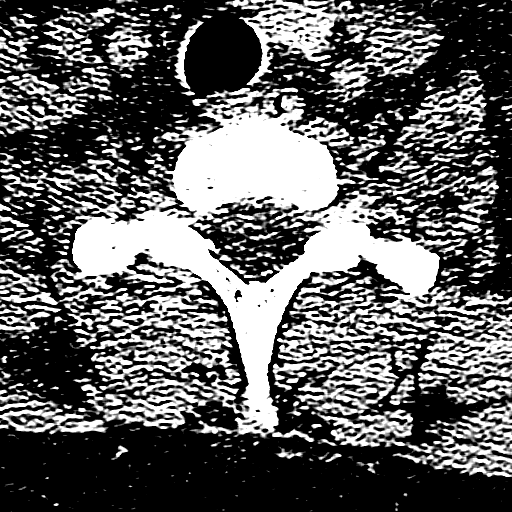
[im 36/117  brain]
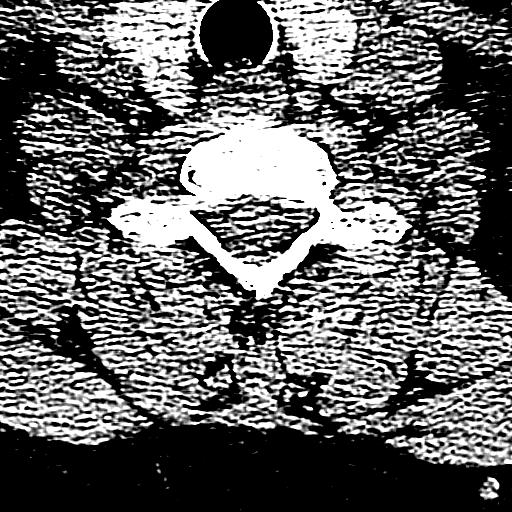
[im 54/117  brain]
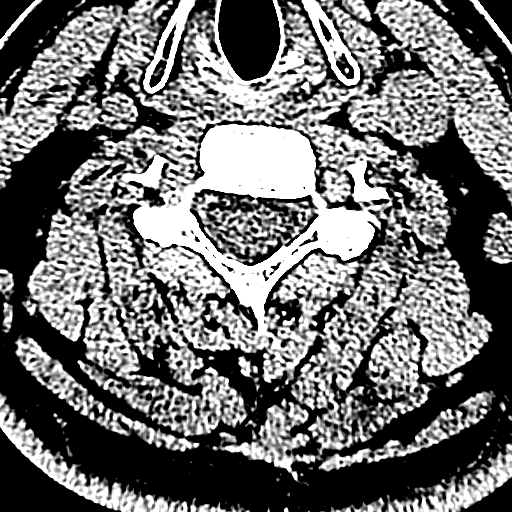
[im 63/117  brain]
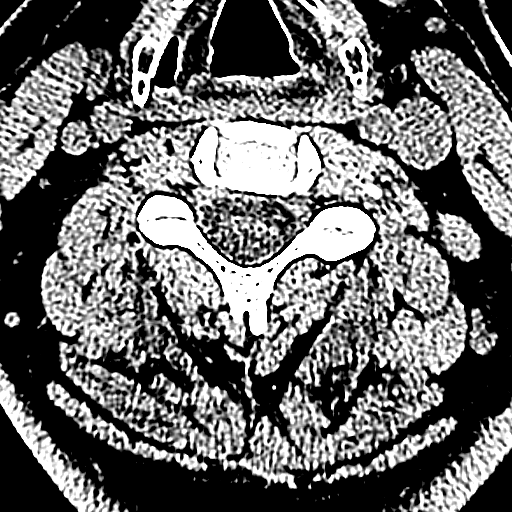
[im 63/117  bone]
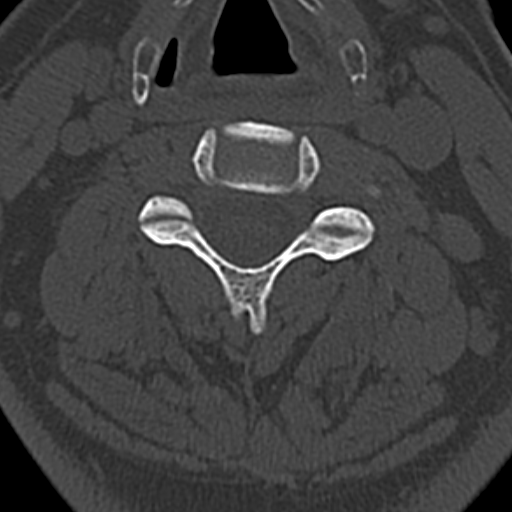
[im 81/117  brain]
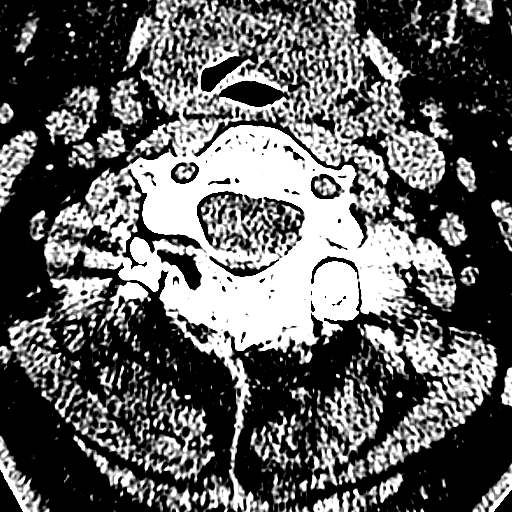
[im 90/117  brain]
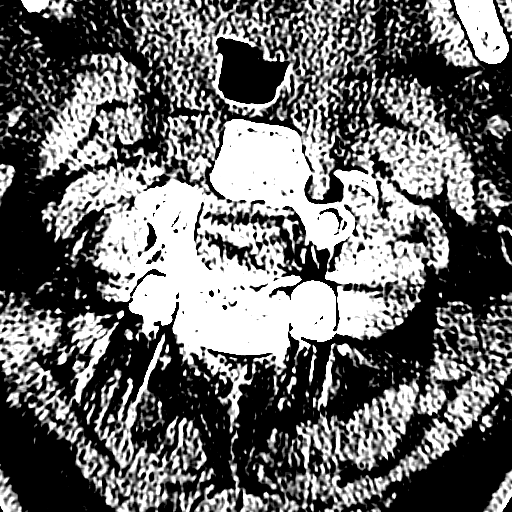
[im 108/117  brain]
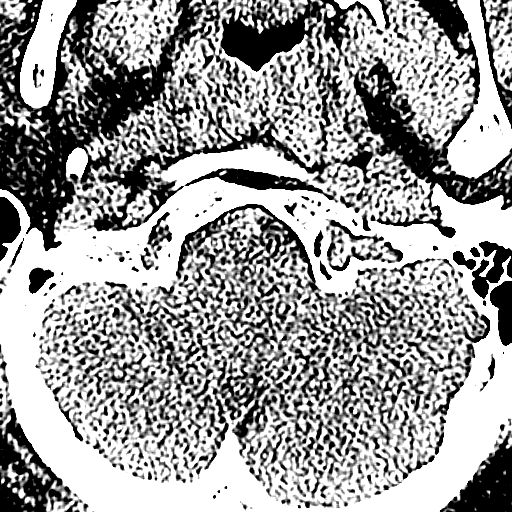

[14 of 47 positions shown; findings below may reference images not displayed]

FINDINGS: CT HEAD FINDINGS

No intracranial hemorrhage. No parenchymal contusion. No midline
shift or mass effect. Basilar cisterns are patent. No skull base
fracture. No fluid in the paranasal sinuses or mastoid air cells.
Orbits are normal.

CT CERVICAL SPINE FINDINGS

There is fusion posteriorly at C1 and C2 with pedicle screws and
posterior fusion rods. Normal alignment of the vertebral bodies. No
acute loss vertebral body height and disc height. Normal facet
articulation. Normal craniocervical junction.

No epidural paraspinal hematoma.
IMPRESSION: 1. No intracranial trauma.
2. No acute cervical spine fracture.
3. Posterior cervical fusion at C1-C2

## 2016-02-01 ENCOUNTER — Emergency Department (HOSPITAL_COMMUNITY)
Admission: EM | Admit: 2016-02-01 | Discharge: 2016-02-01 | Disposition: A | Discharge status: Home / Self Care | Payer: BC Managed Care – PPO | Attending: Emergency Medicine | Admitting: Emergency Medicine

## 2016-02-01 ENCOUNTER — Encounter (HOSPITAL_COMMUNITY): Payer: Self-pay | Admitting: Emergency Medicine

## 2016-02-01 DIAGNOSIS — T781XXA Other adverse food reactions, not elsewhere classified, initial encounter: Secondary | ICD-10-CM | POA: Diagnosis not present

## 2016-02-01 DIAGNOSIS — Z91018 Allergy to other foods: Secondary | ICD-10-CM | POA: Insufficient documentation

## 2016-02-01 DIAGNOSIS — T7840XA Allergy, unspecified, initial encounter: Secondary | ICD-10-CM

## 2016-02-01 DIAGNOSIS — Z792 Long term (current) use of antibiotics: Secondary | ICD-10-CM | POA: Insufficient documentation

## 2016-02-01 DIAGNOSIS — Z79891 Long term (current) use of opiate analgesic: Secondary | ICD-10-CM | POA: Insufficient documentation

## 2016-02-01 DIAGNOSIS — Z79899 Other long term (current) drug therapy: Secondary | ICD-10-CM | POA: Diagnosis not present

## 2016-02-01 HISTORY — DX: Unspecified injury at T7-t10 level of thoracic spinal cord, initial encounter: S24.103A

## 2016-02-01 HISTORY — DX: Unspecified injury at unspecified level of cervical spinal cord, initial encounter: S14.109A

## 2016-02-01 HISTORY — DX: Unspecified injury at t2-t6 level of thoracic spinal cord, initial encounter: S24.102A

## 2016-02-01 MED ORDER — EPINEPHRINE 0.3 MG/0.3ML IJ SOAJ: 0.3000 mg | INTRAMUSCULAR | Status: DC | PRN

## 2016-02-01 MED ORDER — FAMOTIDINE 20 MG PO TABS
40.0000 mg | ORAL_TABLET | Freq: Once | ORAL | Status: AC
  Administered 2016-02-01: 40 mg via ORAL
  Filled 2016-02-01: qty 2

## 2016-02-01 MED ORDER — PREDNISONE 20 MG PO TABS
60.0000 mg | ORAL_TABLET | Freq: Once | ORAL | Status: AC
  Administered 2016-02-01: 60 mg via ORAL
  Filled 2016-02-01: qty 3

## 2016-02-01 NOTE — ED Provider Notes (Signed)
CSN: [id number]     Arrival date & time 02/01/16  1526 History   First MD Initiated Contact with Patient 02/01/16 1538     Chief Complaint  Patient presents with  . Allergic Reaction     (Consider location/radiation/quality/duration/timing/severity/associated sxs/prior Treatment) HPI Comments: 25 y.o. Male with history of spinal cord injury, PTSD presents for allergic reaction.  The patient states that just recently he had what is believed to have been an allergic reaction and was prescribed epipens from his PCP.  They are not sure what the reaction was to.  Today he was eating his normal chicken fingers with cucumber sauce for lunch when he suddenly turned bright red, he felt his mouth swelling, and felt short of breath.  He was with his father and he administered an EpiPen and then EMS was called who gave him 50 mg of benadryl.  Patient reports feeling improved but still feels that his breathing is not normal.   Past Medical History  Diagnosis Date  . Testicular torsion   . Neurogenic bladder disorder   . PTSD (post-traumatic stress disorder)   . Concussion   . Spinal cord injury of T10 vertebra (HCC)   . Spastic paraplegia (HCC)   . Neurogenic bowel   . Spinal cord injury, cervical region (HCC)   . Spinal cord injury of T9 vertebra (HCC)   . T5 spinal cord injury (HCC)   . T6 spinal cord injury [facility])    Past Surgical History  Procedure Laterality Date  . Testicle surgery    . Foot surgery    . Knee surgery    . Neck surgery    . Tympanostomy tube placement    . Hip surgery    . Posterior fusion cervical spine    . Calcaneal osteotomy w/ internal fixation    . Gastrocnemius recession    . Osteotomy tarsal     No family history on file. Social History  Substance Use Topics  . Smoking status: Never Smoker   . Smokeless tobacco: Never Used  . Alcohol Use: No    Review of Systems  Constitutional: Negative for fever, chills and fatigue.  HENT: Negative for congestion and  postnasal drip.   Eyes: Negative for visual disturbance.  Respiratory: Positive for shortness of breath. Negative for cough and chest tightness.   Gastrointestinal: Negative for nausea, vomiting, abdominal pain, diarrhea and constipation.  Genitourinary: Negative for dysuria, urgency and hematuria.  Musculoskeletal: Negative for myalgias and back pain.  Skin: Positive for rash (resolved). Negative for color change, pallor and wound.  Neurological: Negative for dizziness and headaches.  Hematological: Does not bruise/bleed easily.      Allergies  Ondansetron; Bupropion; Duloxetine hcl; Citalopram; Linzess; Nabumetone; Nitrofurantoin monohyd macro; Sitagliptin-metformin hcl; and Tizanidine  Home Medications   Prior to Admission medications   Medication Sig Start Date End Date Taking? Authorizing Provider  baclofen (GABLOFEN) 10000 MCG/20ML SOLN 10,000 mcg by Intrathecal route once.   Yes Historical Provider, MD  baclofen (LIORESAL) 20 MG tablet Take 20 mg by mouth 5 (five) times daily.    Yes Historical Provider, MD  bisacodyl (DULCOLAX) 10 MG suppository Place 10 mg rectally as needed for mild constipation or moderate constipation.   Yes Historical Provider, MD  cephALEXin (KEFLEX) 500 MG capsule Take 500 mg by mouth 4 (four) times daily. 01/28/16 02/10/16 Yes Historical Provider, MD  HYDROcodone-acetaminophen (NORCO/VICODIN) 5-325 MG per tablet Take 1 tablet by mouth every 4 (four) hours as needed for moderate pain.  09/24/14  Yes Historical Provider, MD  EPINEPHrine (EPIPEN 2-PAK) 0.3 mg/0.3 mL IJ SOAJ injection Inject 0.3 mLs (0.3 mg total) into the muscle as needed (anaphylaxis). 02/01/16   Leta BaptistEmily Roe Takumi Din, MD  terbutaline (BRETHINE) 5 MG tablet Take 1 tablet (5 mg total) by mouth 3 (three) times daily. Take 3 times daily for the next 24 hours. Then take as needed. Patient not taking: Reported on 02/01/2016 07/14/15   Arby BarretteMarcy Pfeiffer, MD   BP 127/77 mmHg  Pulse 82  Temp(Src) 98.7 F (37.1  C) (Oral)  Resp 13  Ht 5\' 7"  (1.702 m)  Wt 208 lb (94.348 kg)  BMI 32.57 kg/m2  SpO2 97% Physical Exam  Constitutional: He is oriented to person, place, and time. He appears well-developed and well-nourished. No distress.  HENT:  Head: Normocephalic and atraumatic.  Right Ear: External ear normal.  Left Ear: External ear normal.  Mouth/Throat: Oropharynx is clear and moist. No oropharyngeal exudate.  Eyes: EOM are normal. Pupils are equal, round, and reactive to light.  Neck: Normal range of motion. Neck supple.  Cardiovascular: Normal rate, regular rhythm, normal heart sounds and intact distal pulses.   No murmur heard. Pulmonary/Chest: Effort normal. No respiratory distress. He has no wheezes. He has no rales.  Abdominal: Soft. He exhibits no distension. There is no tenderness.  Musculoskeletal: He exhibits no edema.  Decreased muscle strength and range of motion of the extremities, more function of the upper extremities than the lower  Neurological: He is alert and oriented to person, place, and time.  Skin: Skin is warm and dry. No rash noted. He is not diaphoretic.  Vitals reviewed.   ED Course  Procedures (including critical care time) Labs Review Labs Reviewed - No data to display  Imaging Review No results found. I have personally reviewed and evaluated these images and lab results as part of my medical decision-making.   EKG Interpretation None      MDM  Patient was seen and evaluated in stable condition.  Patient given Pepcid and Prednisone.  He was observed in the emergency department for 4 hours after he had administered epi at home without sign of active allergic reaction.  No abnormal breath sounds, no stridor, no oral swelling, no urticaria, patient well appearing at time of arrival to emergency department and after observation period.  Patient and mother reported comfort with plan for discharge and outpatient follow up.  PCP facilitating follow up with  allergist.  Patient was discharged home in stable condition with strict return precautions.  Patient instructed to not eat foods he had been eating and was given prescription for EpiPens. Final diagnoses:  Allergic reaction, initial encounter    1. Allergic reaction    Leta BaptistEmily Roe Harlee Eckroth, MD 02/01/16 2318

## 2016-02-01 NOTE — ED Notes (Signed)
MD at bedside. 

## 2016-02-01 NOTE — Discharge Instructions (Signed)
You were seen and evaluated today for your allergic reaction. The exact cause is not clear. Do not eat any of the foods that you eat today. Follow-up with her primary care physician as well as with an allergist. Return for sudden worsening or return of symptoms.  Anaphylactic Reaction An anaphylactic reaction is a sudden, severe allergic reaction that involves the whole body. It can be life threatening. A hospital stay is often required. People with asthma, eczema, or hay fever are slightly more likely to have an anaphylactic reaction. CAUSES  An anaphylactic reaction may be caused by anything to which you are allergic. After being exposed to the allergic substance, your immune system becomes sensitized to it. When you are exposed to that allergic substance again, an allergic reaction can occur. Common causes of an anaphylactic reaction include:  Medicines.  Foods, especially peanuts, wheat, shellfish, milk, and eggs.  Insect bites or stings.  Blood products.  Chemicals, such as dyes, latex, and contrast material used for imaging tests. SYMPTOMS  When an allergic reaction occurs, the body releases histamine and other substances. These substances cause symptoms such as tightening of the airway. Symptoms often develop within seconds or minutes of exposure. Symptoms may include:  Skin rash or hives.  Itching.  Chest tightness.  Swelling of the eyes, tongue, or lips.  Trouble breathing or swallowing.  Lightheadedness or fainting.  Anxiety or confusion.  Stomach pains, vomiting, or diarrhea.  Nasal congestion.  A fast or irregular heartbeat (palpitations). DIAGNOSIS  Diagnosis is based on your history of recent exposure to allergic substances, your symptoms, and a physical exam. Your caregiver may also perform blood or urine tests to confirm the diagnosis. TREATMENT  Epinephrine medicine is the main treatment for an anaphylactic reaction. Other medicines that may be used for  treatment include antihistamines, steroids, and albuterol. In severe cases, fluids and medicine to support blood pressure may be given through an intravenous line (IV). Even if you improve after treatment, you need to be observed to make sure your condition does not get worse. This may require a stay in the hospital. [place] a medical alert bracelet or necklace stating your allergy.  You and your family must learn how to use an anaphylaxis kit or give an epinephrine injection to temporarily treat an emergency allergic reaction. Always carry your epinephrine injection or anaphylaxis kit with you. This can be lifesaving if you have a severe reaction.  Do not drive or perform tasks after treatment until the medicines used to treat your reaction have worn off, or until your caregiver says it is okay.  If you have hives or a rash:  Take medicines as directed by your caregiver.  You may use an over-the-counter antihistamine (diphenhydramine) as needed.  Apply cold compresses to the skin or take baths in cool water. Avoid hot baths or showers. SEEK MEDICAL CARE IF:   You develop symptoms of an allergic reaction to a new substance. Symptoms may start right away or minutes later.  You develop a rash, hives, or itching.  You develop new symptoms. SEEK IMMEDIATE MEDICAL CARE IF:   You have swelling of the mouth, difficulty breathing, or wheezing.  You have a tight feeling in your chest or throat.  You develop hives, swelling, or itching all over your body.  You develop severe vomiting or diarrhea.  You feel faint or pass out. This is an emergency. Use your epinephrine injection or anaphylaxis kit as you have been instructed.  Call your local emergency services (911 in [place] Even if you improve after the injection, you need to be examined at a hospital emergency department. MAKE SURE YOU:   Understand these instructions.  Will watch your condition.  Will get help  right away if you are not doing well or get worse.   This information is not intended to replace advice given to you by your health care provider. Make sure you discuss any questions you have with your health care provider.   Document Released: 09/11/2005 Document Revised: 09/16/2013 Document Reviewed: 03/24/2015 Elsevier Interactive Patient Education [employer].

## 2016-02-01 NOTE — ED Notes (Signed)
Bed: WA17 Expected date:  Expected time:  Means of arrival:  Comments: EMS- allergic reaction

## 2016-02-01 NOTE — ED Notes (Signed)
Per EMS, patient had an allergic reaction (unsure of what he is allergic to). Patient is supposed to go to allergic, but "has not had time to go" Patient used his epi pen and patient had 50 mg benadryl PO with EMS.

## 2016-02-01 NOTE — ED Notes (Signed)
Discharge instructions, follow up care, and rx x1 reviewed with patient. Patient verbalized understanding. 

## 2016-06-08 ENCOUNTER — Emergency Department (HOSPITAL_COMMUNITY)
Admission: EM | Admit: 2016-06-08 | Discharge: 2016-06-08 | Disposition: A | Discharge status: Home / Self Care | Payer: BLUE CROSS/BLUE SHIELD | Attending: Emergency Medicine | Admitting: Emergency Medicine

## 2016-06-08 ENCOUNTER — Encounter (HOSPITAL_COMMUNITY): Payer: Self-pay | Admitting: Emergency Medicine

## 2016-06-08 DIAGNOSIS — N4830 Priapism, unspecified: Secondary | ICD-10-CM | POA: Diagnosis present

## 2016-06-08 DIAGNOSIS — Z79899 Other long term (current) drug therapy: Secondary | ICD-10-CM | POA: Insufficient documentation

## 2016-06-08 HISTORY — DX: Autonomic dysreflexia: G90.4

## 2016-06-08 MED ORDER — HYDROCODONE-ACETAMINOPHEN 5-325 MG PO TABS: 2.0000 | ORAL_TABLET | ORAL | 0 refills | Status: DC | PRN

## 2016-06-08 MED ORDER — LIDOCAINE HCL (PF) 1 % IJ SOLN
0.0000 mL | Freq: Once | INTRAMUSCULAR | Status: AC | PRN
Start: 2016-06-08 — End: 2016-06-08
  Administered 2016-06-08: 10 mL via INTRADERMAL
  Filled 2016-06-08: qty 30

## 2016-06-08 NOTE — Discharge Instructions (Signed)
You were seen in the ED today with priapism. We attempted drainage but this failed to significantly improve symptoms. I spoke with Dr. Logan BoresEvans who recommended penile implant. Please call today to discuss this as he is willing to consider a procedure this weekend.   Return to the ED with any new or worsening symptoms.

## 2016-06-08 NOTE — ED Provider Notes (Signed)
Emergency Department Provider Note   I have reviewed the triage vital signs and the nursing notes.   HISTORY  Chief Complaint priaprism   HPI Juan Blake is a 25 y.o. 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 The patient has had an erection since last night (approximately 8 hours). He notes frequent episodes of priapism and is followed by Urology at [facility]. He has tried applying ice and masturbation without relief. He notes significant discomfort and embarrassment which prompted the ED visit.   Past Medical History:  Diagnosis Date  . Autonomic dysreflexia   . Concussion   . Neurogenic bladder disorder   . Neurogenic bowel   . PTSD (post-traumatic stress disorder)   . Spastic paraplegia (HCC)   . Spinal cord injury of T10 vertebra (HCC)   . Spinal cord injury of T9 vertebra (HCC)   . Spinal cord injury, cervical region (HCC)   . T5 spinal cord injury (HCC)   . T6 spinal cord injury (HCC)   . Testicular torsion     Patient Active Problem List   Diagnosis Date Noted  . Psychotic disorder 07/14/2015  . Muscle spasticity 07/08/2014  . Neurogenic bladder 07/08/2014  . 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 CALCANEAL OSTEOTOMY W/ INTERNAL FIXATION    . FOOT SURGERY    . GASTROCNEMIUS RECESSION    . hip surgery    . KNEE SURGERY    . NECK SURGERY    . OSTEOTOMY TARSAL    . POSTERIOR FUSION CERVICAL SPINE    . TESTICLE SURGERY    . TYMPANOSTOMY TUBE PLACEMENT      Current Outpatient Rx  . Order #: [id number] Class: Historical Med  . Order #: [id number] Class: Historical Med  . Order #: [id number] Class: Historical Med  . Order #: [id number] Class: Print  . Order #: [id number] Class: Historical Med  . Order #: [id number] Class: Print  . Order #:  [id number] Class: Print    Allergies Ondansetron; Bupropion; Duloxetine hcl; Citalopram; Linzess [linaclotide]; Nabumetone; Nitrofurantoin monohyd macro; Sitagliptin-metformin hcl; and Tizanidine  History reviewed. No pertinent family history.  Social History Social History  Substance Use Topics  . Smoking status: Never Smoker  . Smokeless tobacco: Never Used  . Alcohol use No    Review of Systems  Constitutional: No fever/chills Eyes: No visual changes. ENT: No sore throat. Cardiovascular: Denies chest pain. Respiratory: Denies shortness of breath. Gastrointestinal: No abdominal pain.  No nausea, no vomiting.  No diarrhea.  No constipation. Genitourinary: Negative for dysuria. Positive penile erection for the last 8 hours.  Musculoskeletal: Negative for back pain. Skin: Negative for rash. Neurological: Negative for headaches, focal weakness or numbness.  10-point ROS otherwise negative.  ____________________________________________   PHYSICAL EXAM:  VITAL SIGNS: ED Triage Vitals [06/08/16 1143]  Enc Vitals Group     BP 154/99     Pulse Rate 107     Resp 16     Temp 98.1 F (36.7 C)     Temp Source Oral     SpO2 98 %     Pain Score 0   Constitutional: Alert and oriented. Well appearing and in no acute distress. Eyes: Conjunctivae are normal.  Head: Atraumatic. Nose: No congestion/rhinnorhea. Mouth/Throat: Mucous membranes are moist.  Oropharynx non-erythematous. Neck: No stridor.  Cardiovascular: Sinus tachycardia. Good peripheral circulation.  Grossly normal heart sounds.   Respiratory: Normal respiratory effort.  No retractions. Lungs CTAB. Gastrointestinal: Soft and nontender. No distention.  Genitourinary: Erect penis with no discoloration or urethral discharge. Normal testicle exam.  Musculoskeletal: Spastic paralysis of the lower extremities.  Neurologic:  Normal speech and language. Baseline LE and groin numbness.  Skin:  Skin is warm, dry and intact.  No rash noted. Psychiatric: Mood and affect are normal. Speech and behavior are normal. ____________________________________________  RADIOLOGY  None ____________________________________________   PROCEDURES  Procedure(s) performed:   Irrigate corpus cavern, priapism Date/Time: 06/08/2016 7:12 PM Performed by: LONG, JOSHUA G Authorized by: Maia PlanLONG, JOSHUA G  Consent: Verbal consent obtained. Risks and benefits: risks, benefits and alternatives were discussed Consent given by: patient Patient understanding: patient states understanding of the procedure being performed Required items: required blood products, implants, devices, and special equipment available Patient identity confirmed: verbally with patient and arm band Preparation: Patient was prepped and draped in the usual sterile fashion. Local anesthesia used: yes Anesthesia: local infiltration  Anesthesia: Local anesthesia used: yes Local Anesthetic: lidocaine 1% without epinephrine Anesthetic total: 5 mL  Sedation: Patient sedated: no Patient tolerance: Patient tolerated the procedure well with no immediate complications Comments: A dorsal penile block was performed and 5 ml of blood withdrawn from the right corpus cavernosum with minimal improvement in tumescence. Patient requested that the procedure be terminated due to futility.     ____________________________________________   INITIAL IMPRESSION / ASSESSMENT AND PLAN / ED COURSE  Pertinent labs & imaging results that were available during my care of the patient were reviewed by me and considered in my medical decision making (see chart for details).  Patient presents to the ED for evaluation of priapism, high-flow type. He is insistent that penile drainage improves symptoms. Will discuss with the patient's Urologist given frequent ED presentations and outpatient visits discussing that penile implant is the patient's only option to treat this recurrent issue.    01:15 PM Spoke with the patient's urologist Dr[facility] at [facility] he states that the patient has a high flow priapism that will likely not respond to bedside drainage or medications. He states he had multiple conversations about placing a penile implant as the ultimate solution to this issue but the patient has been resistant. I discussed this with the patient to states that he has had drainage work in the past and he would like this to be done. I discussed the risks and benefits and will proceed with the procedure.   01:37 PM 0.5 ml of blood removed with little improvement in symptoms. Patient states that he believes he is ready for the penile implant and will call Dr. Logan BoresEvans this afternoon. Dr. Logan BoresEvans stated to me that he would be open to performing the procedure on an urgent basis but there is no emergent reason for transfer or evaluation today. Will refill the patient's hydrocodone and he will call his urologist upon discharge.   At this time, I do not feel there is any life-threatening condition present. I have reviewed and discussed all results (EKG, imaging, lab, urine as appropriate), exam findings with patient. I have reviewed nursing notes and appropriate previous records.  I feel the patient is safe to be discharged home without further emergent workup. Discussed usual and customary return precautions. Patient and family (if present) verbalize understanding and are comfortable with this plan.  Patient will follow-up with their primary care provider. If they do not have a primary care provider, information for follow-up  has been provided to them. All questions have been answered.  ____________________________________________  FINAL CLINICAL IMPRESSION(S) / ED DIAGNOSES  Final diagnoses:  Priapism     MEDICATIONS GIVEN DURING THIS VISIT:  Medications  lidocaine (PF) (XYLOCAINE) 1 % injection 0-30 mL (10 mLs Intradermal Given by Other 06/08/16 1318)     NEW OUTPATIENT MEDICATIONS  STARTED DURING THIS VISIT:  Discharge Medication List as of 06/08/2016  1:41 PM    START taking these medications   Details  !! HYDROcodone-acetaminophen (NORCO/VICODIN) 5-325 MG tablet Take 2 tablets by mouth every 4 (four) hours as needed., Starting Thu 06/08/2016, Print     !! - Potential duplicate medications found. Please discuss with provider.        Note:  This document was prepared using Dragon voice recognition software and may include unintentional dictation errors.  Alona Bene, MD Emergency Medicine   Maia Plan, MD 06/08/16 [phone]

## 2016-06-08 NOTE — ED Triage Notes (Signed)
Pt has hx of spinal cord injury and paraplegia. Pt reports his implant for priaprism is not working and he is having autonaumic dysreflexia symptoms such as tunnel vision.

## 2016-07-12 ENCOUNTER — Emergency Department (HOSPITAL_BASED_OUTPATIENT_CLINIC_OR_DEPARTMENT_OTHER)
Admission: EM | Admit: 2016-07-12 | Discharge: 2016-07-12 | Disposition: A | Discharge status: Home / Self Care | Payer: BLUE CROSS/BLUE SHIELD | Attending: Emergency Medicine | Admitting: Emergency Medicine

## 2016-07-12 ENCOUNTER — Emergency Department (HOSPITAL_BASED_OUTPATIENT_CLINIC_OR_DEPARTMENT_OTHER): Payer: BLUE CROSS/BLUE SHIELD

## 2016-07-12 ENCOUNTER — Encounter (HOSPITAL_BASED_OUTPATIENT_CLINIC_OR_DEPARTMENT_OTHER): Payer: Self-pay | Admitting: *Deleted

## 2016-07-12 DIAGNOSIS — K5900 Constipation, unspecified: Secondary | ICD-10-CM

## 2016-07-12 DIAGNOSIS — Z79899 Other long term (current) drug therapy: Secondary | ICD-10-CM | POA: Insufficient documentation

## 2016-07-12 LAB — I-STAT CG4 LACTIC ACID, ED: LACTIC ACID, VENOUS: 1.59 mmol/L (ref 0.5–1.9)

## 2016-07-12 LAB — CBC WITH DIFFERENTIAL/PLATELET
BASOS ABS: 0.1 10*3/uL (ref 0.0–0.1)
Basophils Relative: 1 %
Eosinophils Absolute: 0.1 10*3/uL (ref 0.0–0.7)
Eosinophils Relative: 1 %
HCT: 43.7 % (ref 39.0–52.0)
HEMOGLOBIN: 15.6 g/dL (ref 13.0–17.0)
LYMPHS ABS: 2.2 10*3/uL (ref 0.7–4.0)
Lymphocytes Relative: 28 %
MCH: 30 pg (ref 26.0–34.0)
MCHC: 35.7 g/dL (ref 30.0–36.0)
MCV: 84 fL (ref 78.0–100.0)
MONOS PCT: 8 %
Monocytes Absolute: 0.6 10*3/uL (ref 0.1–1.0)
NEUTROS PCT: 62 %
Neutro Abs: 4.9 10*3/uL (ref 1.7–7.7)
Platelets: 197 10*3/uL (ref 150–400)
RBC: 5.2 MIL/uL (ref 4.22–5.81)
RDW: 13.2 % (ref 11.5–15.5)
WBC: 7.9 10*3/uL (ref 4.0–10.5)

## 2016-07-12 LAB — BASIC METABOLIC PANEL
Anion gap: 9 (ref 5–15)
BUN: 16 mg/dL (ref 6–20)
CALCIUM: 9.7 mg/dL (ref 8.9–10.3)
CHLORIDE: 100 mmol/L — AB (ref 101–111)
CO2: 29 mmol/L (ref 22–32)
CREATININE: 0.58 mg/dL — AB (ref 0.61–1.24)
GFR calc non Af Amer: >60 mL/min (ref >60)
Glucose, Bld: 166 mg/dL — ABNORMAL HIGH (ref 65–99)
Potassium: 4.1 mmol/L (ref 3.5–5.1)
SODIUM: 138 mmol/L (ref 135–145)

## 2016-07-12 MED ORDER — PEG 3350-KCL-NABCB-NACL-NASULF 236 G PO SOLR: 2.0000 L | Freq: Once | ORAL | 0 refills | Status: AC

## 2016-07-12 MED ORDER — IOPAMIDOL (ISOVUE-300) INJECTION 61%
100.0000 mL | Freq: Once | INTRAVENOUS | Status: AC | PRN
  Administered 2016-07-12: 100 mL via INTRAVENOUS

## 2016-07-12 MED ORDER — SODIUM CHLORIDE 0.9 % IV BOLUS (SEPSIS)
1000.0000 mL | Freq: Once | INTRAVENOUS | Status: AC
  Administered 2016-07-12: 1000 mL via INTRAVENOUS

## 2016-07-12 NOTE — ED Provider Notes (Signed)
[facility] Provider Note   CSN: [id number] Arrival date & time: 07/12/16  1650   By signing my name below, I, Teofilo Pod, attest that this documentation has been prepared under the direction and in the presence of [employer], PA-C. Electronically Signed: Teofilo Pod, ED Scribe. 07/12/2016. 5:25 PM.    History   Chief Complaint Chief Complaint  Patient presents with  . Constipation    The history is provided by the patient. No language interpreter was used.   HPI Comments:  Juan Blake is a 25 y.o. male with autonomic dysreflexia, Neurogenic bladder, spinal cord injury  who presents to the Emergency Department complaining of constant constipation x 3 days. Pt states that he has not had a BM in 3 days. Pt reports PMHx of hereditary spastic paraplegia, and that associated spasms have worsened in his back. Pt reports that in January he fell out of his wheelchair going up a hill and landed on his head and woke up the next day with no feeling in his arm.  Pt is having a penile implant and a vasectomy surgery in a few days. Pt reports using an intrathecal baclofen pump, and takes oral baclofen. Pt also uses a suprapubic catheter and is now due for a bag change after 7 days. Pt has taken colace and has dome an enema for constipation with no relief. Pt denies any abdominal pain and fever.   Past Medical History:  Diagnosis Date  . Autonomic dysreflexia   . Concussion   . Neurogenic bladder disorder   . Neurogenic bowel   . PTSD (post-traumatic stress disorder)   . Spastic paraplegia (HCC)   . Spinal cord injury of T10 vertebra (HCC)   . Spinal cord injury of T9 vertebra (HCC)   . Spinal cord injury, cervical region (HCC)   . T5 spinal cord injury (HCC)   . T6 spinal cord injury (HCC)   . Testicular torsion     Patient Active Problem List   Diagnosis Date Noted  . Psychotic disorder 07/14/2015  . Muscle spasticity 07/08/2014  . Neurogenic  bladder 07/08/2014  . 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 CALCANEAL OSTEOTOMY W/ INTERNAL FIXATION    . FOOT SURGERY    . GASTROCNEMIUS RECESSION    . hip surgery    . KNEE SURGERY    . NECK SURGERY    . OSTEOTOMY TARSAL    . POSTERIOR FUSION CERVICAL SPINE    . TESTICLE SURGERY    . TYMPANOSTOMY TUBE PLACEMENT         Home Medications    Prior to Admission medications   Medication Sig Start Date End Date Taking? Authorizing Provider  bisacodyl (DULCOLAX) 10 MG suppository Place 10 mg rectally as needed for mild constipation or moderate constipation.   Yes Historical Provider, MD  gabapentin (NEURONTIN) 300 MG capsule Take 300 mg by mouth 3 (three) times daily.   Yes Historical Provider, MD  baclofen (GABLOFEN) 10000 MCG/20ML SOLN 10,000 mcg by Intrathecal route once.    Historical Provider, MD  baclofen (LIORESAL) 20 MG tablet Take 20 mg by mouth 5 (five) times daily.     Historical Provider, MD  EPINEPHrine (EPIPEN 2-PAK) 0.3 mg/0.3 mL IJ SOAJ injection Inject 0.3 mLs (0.3 mg total) into the muscle as needed (anaphylaxis). 02/01/16   Leta Baptist, MD  HYDROcodone-acetaminophen (NORCO/VICODIN) 5-325 MG per tablet Take 1 tablet by  mouth every 4 (four) hours as needed for moderate pain.  09/24/14   Historical Provider, MD  HYDROcodone-acetaminophen (NORCO/VICODIN) 5-325 MG tablet Take 2 tablets by mouth every 4 (four) hours as needed. 06/08/16   Maia PlanJoshua G Long, MD  polyethylene glycol (GOLYTELY) 236 g solution Take 2,000 mLs by mouth once. 07/12/16 07/12/16  Perley Arthurs, PA-C  terbutaline (BRETHINE) 5 MG tablet Take 1 tablet (5 mg total) by mouth 3 (three) times daily. Take 3 times daily for the next 24 hours. Then take as needed. Patient not taking: Reported on 02/01/2016 07/14/15   Arby BarretteMarcy Pfeiffer, MD    Family History History reviewed. No pertinent family history.  Social History Social History    Substance Use Topics  . Smoking status: Never Smoker  . Smokeless tobacco: Never Used  . Alcohol use No     Allergies   Ondansetron; Bupropion; Duloxetine hcl; Citalopram; Linzess [linaclotide]; Nabumetone; Nitrofurantoin monohyd macro; Sitagliptin-metformin hcl; and Tizanidine   Review of Systems Review of Systems 10 Systems reviewed and are negative for acute change except as noted in the HPI.   Physical Exam Updated Vital Signs BP 133/81   Pulse 83   Temp 97.9 F (36.6 C) (Oral)   Resp 16   Wt 94.3 kg   SpO2 98%   BMI 32.58 kg/m   Physical Exam  Constitutional: He is oriented to person, place, and time. He appears well-developed and well-nourished. No distress.  HENT:  Head: Normocephalic and atraumatic.  Mouth/Throat: Oropharynx is clear and moist.  Eyes: Conjunctivae and EOM are normal. Pupils are equal, round, and reactive to light.  Neck: Normal range of motion.  Cardiovascular: Normal rate, regular rhythm and intact distal pulses.   Pulmonary/Chest: Effort normal and breath sounds normal.  Abdominal: Soft. He exhibits no distension and no mass. There is no tenderness. There is no rebound and no guarding. No hernia.  Normoactive bowel sounds, suprapubic catheter in place.  Genitourinary:  Genitourinary Comments: Digital rectal exam with no significant stool in the rectal vault  Musculoskeletal: Normal range of motion.  Neurological: He is alert and oriented to person, place, and time.  Skin: He is not diaphoretic.  Psychiatric: He has a normal mood and affect.  Nursing note and vitals reviewed.    ED Treatments / Results  DIAGNOSTIC STUDIES:  Oxygen Saturation is 99% on RA, normal by my interpretation.    COORDINATION OF CARE:  5:25 PM Discussed treatment plan with pt at bedside and pt agreed to plan.   Labs (all labs ordered are listed, but only abnormal results are displayed) Labs Reviewed  BASIC METABOLIC PANEL - Abnormal; Notable for the  following:       Result Value   Chloride 100 (*)    Glucose, Bld 166 (*)    Creatinine, Ser 0.58 (*)    All other components within normal limits  CBC WITH DIFFERENTIAL/PLATELET  I-STAT CG4 LACTIC ACID, ED    EKG  EKG Interpretation None       Radiology Ct Abdomen Pelvis W Contrast  Result Date: 07/12/2016 CLINICAL DATA:  Constipation for 3 days, diabetes mellitus, history spinal cord injury with spastic paraplegia and neurogenic bladder EXAM: CT ABDOMEN AND PELVIS WITH CONTRAST TECHNIQUE: Multidetector CT imaging of the abdomen and pelvis was performed using the standard protocol following bolus administration of intravenous contrast. Sagittal and coronal MPR images reconstructed from axial data set. CONTRAST:  100mL ISOVUE-300 IOPAMIDOL (ISOVUE-300) INJECTION 61% IV. Dilute oral contrast. COMPARISON:  11/05/2014 FINDINGS: Lower  chest: Lung bases clear Hepatobiliary: Fatty infiltration of liver. Liver and gallbladder otherwise unremarkable. Pancreas: Normal appearance Spleen: Tiny nonspecific low-attenuation focus within spleen 7 mm diameter image 27 not definitely seen on previous exam. Adrenals/Urinary Tract: Adrenal glands normal appearance. Suprapubic bladder catheter. Kidneys, ureters, and bladder otherwise normal appearance. Unremarkable prostate gland. Stomach/Bowel: Normal appendix. Stomach and bowel loops normal appearance. Normal stool burden. No bowel dilatation or bowel wall thickening. Vascular/Lymphatic: Normal appearance Reproductive: N/A Other: RIGHT lower quadrant mechanical device with intraspinal lead. No free air or free fluid. No hernia. Musculoskeletal: No acute osseous findings. IMPRESSION: Tiny nonspecific 7 mm splenic lesion not definitely seen on previous exam, uncertain significance. New line mild fatty infiltration of liver. Remainder of exam unremarkable. Electronically Signed   By: Ulyses Southward M.D.   On: 07/12/2016 19:28    Procedures Procedures (including  critical care time)  Medications Ordered in ED Medications  sodium chloride 0.9 % bolus 1,000 mL (0 mLs Intravenous Stopped 07/12/16 1853)  iopamidol (ISOVUE-300) 61 % injection 100 mL (100 mLs Intravenous Contrast Given 07/12/16 1850)     Initial Impression / Assessment and Plan / ED Course  I have reviewed the triage vital signs and the nursing notes.  Pertinent labs & imaging results that were available during my care of the patient were reviewed by me and considered in my medical decision making (see chart for details).  Clinical Course    Vitals:   07/12/16 1658 07/12/16 1702 07/12/16 1932 07/12/16 2033  BP:  165/91 141/81 133/81  Pulse:  108 93 83  Resp:   18 16  Temp:  98.1 F (36.7 C) 97.9 F (36.6 C)   TempSrc:  Oral Oral   SpO2:  99% 99% 98%  Weight: 94.3 kg       Medications  sodium chloride 0.9 % bolus 1,000 mL (0 mLs Intravenous Stopped 07/12/16 1853)  iopamidol (ISOVUE-300) 61 % injection 100 mL (100 mLs Intravenous Contrast Given 07/12/16 1850)    Juan Blake is 25 y.o. male presenting with Constipation, he has not had a bowel movement in 3 days, he has been using stool softeners and enemas with little relief. His blood pressure and heart rate are elevated. Patient with no significant stool in the rectal vault and given his tachycardia and elevated blood pressure concerned they may have an acute abdominal process which he may be insensate to.   Blood work reassuring, CAT scan with no acute abnormality, discussed results with attending who agrees with this patient is safe to discharge to home.  Evaluation does not show pathology that would require ongoing emergent intervention or inpatient treatment. Pt is hemodynamically stable and mentating appropriately. Discussed findings and plan with patient/guardian, who agrees with care plan. All questions answered. Return precautions discussed and outpatient follow up given.      Final Clinical Impressions(s) / ED  Diagnoses   Final diagnoses:  Constipation, unspecified constipation type    New Prescriptions Discharge Medication List as of 07/12/2016  8:28 PM    START taking these medications   Details  polyethylene glycol (GOLYTELY) 236 g solution Take 2,000 mLs by mouth once., Starting Wed 07/12/2016, Print       I personally performed the services described in this documentation, which was scribed in my presence. The recorded information has been reviewed and is accurate.    Wynetta Emery, PA-C 07/12/16 2347    Rolland Porter, MD 07/23/16 [phone]

## 2016-07-12 NOTE — ED Triage Notes (Signed)
Pt c/o no BM x 3 days, no relief from OTC

## 2016-07-12 NOTE — Discharge Instructions (Signed)
Please follow with your primary care doctor in the next 2 days for a check-up. They must obtain records for further management.  ° °Do not hesitate to return to the Emergency Department for any new, worsening or concerning symptoms.  ° °

## 2016-08-02 ENCOUNTER — Encounter (HOSPITAL_BASED_OUTPATIENT_CLINIC_OR_DEPARTMENT_OTHER): Payer: Self-pay | Admitting: Emergency Medicine

## 2016-08-02 ENCOUNTER — Emergency Department (HOSPITAL_BASED_OUTPATIENT_CLINIC_OR_DEPARTMENT_OTHER)
Admission: EM | Admit: 2016-08-02 | Discharge: 2016-08-02 | Disposition: A | Discharge status: Other Acute Inpatient Hospital | Payer: BLUE CROSS/BLUE SHIELD | Attending: Emergency Medicine | Admitting: Emergency Medicine

## 2016-08-02 DIAGNOSIS — Y828 Other medical devices associated with adverse incidents: Secondary | ICD-10-CM | POA: Diagnosis not present

## 2016-08-02 DIAGNOSIS — T8361XA Infection and inflammatory reaction due to implanted penile prosthesis, initial encounter: Secondary | ICD-10-CM | POA: Diagnosis not present

## 2016-08-02 DIAGNOSIS — Z4889 Encounter for other specified surgical aftercare: Secondary | ICD-10-CM | POA: Diagnosis present

## 2016-08-02 LAB — CBC WITH DIFFERENTIAL/PLATELET
BASOS PCT: 0 %
Basophils Absolute: 0 10*3/uL (ref 0.0–0.1)
EOS ABS: 0.1 10*3/uL (ref 0.0–0.7)
EOS PCT: 1 %
HEMATOCRIT: 40.6 % (ref 39.0–52.0)
Hemoglobin: 13.6 g/dL (ref 13.0–17.0)
Lymphocytes Relative: 14 %
Lymphs Abs: 1.8 10*3/uL (ref 0.7–4.0)
MCH: 28.6 pg (ref 26.0–34.0)
MCHC: 33.5 g/dL (ref 30.0–36.0)
MCV: 85.5 fL (ref 78.0–100.0)
MONO ABS: 1.1 10*3/uL — AB (ref 0.1–1.0)
MONOS PCT: 8 %
NEUTROS ABS: 10.2 10*3/uL — AB (ref 1.7–7.7)
Neutrophils Relative %: 77 %
PLATELETS: 227 10*3/uL (ref 150–400)
RBC: 4.75 MIL/uL (ref 4.22–5.81)
RDW: 12.6 % (ref 11.5–15.5)
WBC: 13.2 10*3/uL — ABNORMAL HIGH (ref 4.0–10.5)

## 2016-08-02 LAB — BASIC METABOLIC PANEL
Anion gap: 11 (ref 5–15)
BUN: 11 mg/dL (ref 6–20)
CALCIUM: 9.7 mg/dL (ref 8.9–10.3)
CO2: 29 mmol/L (ref 22–32)
CREATININE: 0.65 mg/dL (ref 0.61–1.24)
Chloride: 97 mmol/L — ABNORMAL LOW (ref 101–111)
GFR calc Af Amer: >60 mL/min (ref >60)
GFR calc non Af Amer: >60 mL/min (ref >60)
GLUCOSE: 150 mg/dL — AB (ref 65–99)
Potassium: 3.5 mmol/L (ref 3.5–5.1)
Sodium: 137 mmol/L (ref 135–145)

## 2016-08-02 MED ORDER — PIPERACILLIN-TAZOBACTAM 3.375 G IVPB 30 MIN
3.3750 g | Freq: Once | INTRAVENOUS | Status: AC
Start: 2016-08-02 — End: 2016-08-02
  Administered 2016-08-02: 3.375 g via INTRAVENOUS
  Filled 2016-08-02: qty 50

## 2016-08-02 MED ORDER — VANCOMYCIN HCL IN DEXTROSE 1-5 GM/200ML-% IV SOLN
1000.0000 mg | Freq: Once | INTRAVENOUS | Status: AC
  Administered 2016-08-02: 1000 mg via INTRAVENOUS
  Filled 2016-08-02: qty 200

## 2016-08-02 MED ORDER — SODIUM CHLORIDE 0.9 % IV BOLUS (SEPSIS)
1000.0000 mL | Freq: Once | INTRAVENOUS | Status: AC
  Administered 2016-08-02: 1000 mL via INTRAVENOUS

## 2016-08-02 MED ORDER — PIPERACILLIN-TAZOBACTAM 3.375 G IVPB
3.3750 g | Freq: Once | INTRAVENOUS | Status: DC
  Filled 2016-08-02: qty 50

## 2016-08-02 NOTE — ED Notes (Signed)
Pt leaving with [facility] at this time.

## 2016-08-02 NOTE — ED Provider Notes (Signed)
[facility] Provider Note   CSN: [id number] Arrival date & time: 08/02/16  1714     History   Chief Complaint Chief Complaint  Patient presents with  . Post-op Problem    HPI Loretha Stapleraul M Heist is a 25 y.o. male.  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 He also recently developed priapism requiring shunts, followed by the insertion of a two-piece penile prosthesis. This was performed at [facility] by Urologist, Dr. Logan BoresEvans approximately one month ago.  This evening, while riding in the car, he felt a pop, then looked at his penis and testicles and noted they were covered in blood. He denies any significant discomfort. He denies any fevers or chills. He does report having some increased swelling of the testicles over the past week. He denies discomfort, however has no sensation at this level.   The history is provided by the patient.    Past Medical History:  Diagnosis Date  . Autonomic dysreflexia   . Concussion   . Neurogenic bladder disorder   . Neurogenic bowel   . PTSD (post-traumatic stress disorder)   . Spastic paraplegia (HCC)   . Spinal cord injury of T10 vertebra (HCC)   . Spinal cord injury of T9 vertebra (HCC)   . Spinal cord injury, cervical region (HCC)   . T5 spinal cord injury (HCC)   . T6 spinal cord injury (HCC)   . Testicular torsion     Patient Active Problem List   Diagnosis Date Noted  . Psychotic disorder 07/14/2015  . Muscle spasticity 07/08/2014  . Neurogenic bladder 07/08/2014  . 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 CALCANEAL OSTEOTOMY W/ INTERNAL FIXATION    . FOOT SURGERY    . GASTROCNEMIUS RECESSION    . hip surgery    . KNEE SURGERY    . NECK SURGERY    . OSTEOTOMY TARSAL    . POSTERIOR FUSION CERVICAL SPINE    .  TESTICLE SURGERY    . TYMPANOSTOMY TUBE PLACEMENT         Home Medications    Prior to Admission medications   Medication Sig Start Date End Date Taking? Authorizing Provider  baclofen (GABLOFEN) 10000 MCG/20ML SOLN 10,000 mcg by Intrathecal route once.    Historical Provider, MD  baclofen (LIORESAL) 20 MG tablet Take 20 mg by mouth 5 (five) times daily.     Historical Provider, MD  bisacodyl (DULCOLAX) 10 MG suppository Place 10 mg rectally as needed for mild constipation or moderate constipation.    Historical Provider, MD  EPINEPHrine (EPIPEN 2-PAK) 0.3 mg/0.3 mL IJ SOAJ injection Inject 0.3 mLs (0.3 mg total) into the muscle as needed (anaphylaxis). 02/01/16   Leta BaptistEmily Roe Nguyen, MD  gabapentin (NEURONTIN) 300 MG capsule Take 300 mg by mouth 3 (three) times daily.    Historical Provider, MD  HYDROcodone-acetaminophen (NORCO/VICODIN) 5-325 MG per tablet Take 1 tablet by mouth every 4 (four) hours as needed for moderate pain.  09/24/14   Historical Provider, MD  HYDROcodone-acetaminophen (NORCO/VICODIN) 5-325 MG tablet Take 2 tablets by mouth every 4 (four) hours as needed. 06/08/16   Maia PlanJoshua G Long, MD  terbutaline (BRETHINE) 5 MG tablet Take 1 tablet (5 mg total) by mouth 3 (three) times daily. Take 3 times daily for the next 24 hours. Then take as needed.  Patient not taking: Reported on 02/01/2016 07/14/15   Arby BarretteMarcy Pfeiffer, MD    Family History History reviewed. No pertinent family history.  Social History Social History  Substance Use Topics  . Smoking status: Never Smoker  . Smokeless tobacco: Never Used  . Alcohol use No     Allergies   Ondansetron; Bupropion; Duloxetine hcl; Citalopram; Linzess [linaclotide]; Nabumetone; Nitrofurantoin monohyd macro; Sitagliptin-metformin hcl; and Tizanidine   Review of Systems Review of Systems  All other systems reviewed and are negative.    Physical Exam Updated Vital Signs BP (!) 172/108 (BP Location: Right Arm)   Pulse 120   Temp  98.3 F (36.8 C) (Oral)   Resp 18   SpO2 98%   Physical Exam  Constitutional: He appears well-developed and well-nourished. No distress.  HENT:  Head: Normocephalic and atraumatic.  Neck: Normal range of motion. Neck supple.  Cardiovascular: Normal rate.   No murmur heard. Pulmonary/Chest: Effort normal.  Abdominal: Soft. He exhibits no distension.  Genitourinary:  Genitourinary Comments: The penis is erect. There is dried blood mixed with purulent material on the testicles. There is a small hole on the testicles just inferior to the base of the penis that is draining a gray-bloody material.  Skin: Skin is warm and dry. He is not diaphoretic.  Nursing note and vitals reviewed.    ED Treatments / Results  Labs (all labs ordered are listed, but only abnormal results are displayed) Labs Reviewed - No data to display  EKG  EKG Interpretation None       Radiology No results found.  Procedures Procedures (including critical care time)  Medications Ordered in ED Medications - No data to display   Initial Impression / Assessment and Plan / ED Course  I have reviewed the triage vital signs and the nursing notes.  Pertinent labs & imaging results that were available during my care of the patient were reviewed by me and considered in my medical decision making (see chart for details).  Clinical Course     Patient with history of spinal cord injury with autonomic dysreflexia and neurogenic bladder. He is status post penile implant last month at [facility]. He presents today with drainage that appears to be purulent and suspected penile implant infection. I have discussed this patient with Dr. Earlene Plateravis who is the urologist on[facility] at [facility] He is recommending I transfer the patient to the emergency department at [facility] or he can be evaluated and admitted. I've spoken with Drs. Zavitz who agrees to accept in transfer.  The patient has also been given vancomycin and Zosyn as  recommended by Dr. Earlene Plateravis.  Final Clinical Impressions(s) / ED Diagnoses   Final diagnoses:  None    New Prescriptions New Prescriptions   No medications on file     Geoffery Lyonsouglas Merari Pion, MD 08/02/16 1811

## 2016-08-02 NOTE — ED Triage Notes (Signed)
Patient reports that he had a penile implant placed 2 weeks ago and now he is having bleeding from the site.

## 2017-09-06 DIAGNOSIS — Z993 Dependence on wheelchair: Secondary | ICD-10-CM | POA: Insufficient documentation

## 2018-03-19 ENCOUNTER — Encounter: Payer: Self-pay | Admitting: Rehabilitative and Restorative Service Providers"

## 2018-03-19 ENCOUNTER — Ambulatory Visit: Payer: Medicaid Other | Attending: Psychiatry | Admitting: Rehabilitative and Restorative Service Providers"

## 2018-03-19 VITALS — BP 136/94 | HR 75

## 2018-03-19 DIAGNOSIS — R2689 Other abnormalities of gait and mobility: Secondary | ICD-10-CM | POA: Insufficient documentation

## 2018-03-19 DIAGNOSIS — R252 Cramp and spasm: Secondary | ICD-10-CM | POA: Insufficient documentation

## 2018-03-19 DIAGNOSIS — R293 Abnormal posture: Secondary | ICD-10-CM | POA: Diagnosis present

## 2018-03-19 DIAGNOSIS — M6281 Muscle weakness (generalized): Secondary | ICD-10-CM | POA: Diagnosis present

## 2018-03-19 NOTE — Therapy (Signed)
[facility] [address] [place], [place], [address] Phone: [phone]   Fax:  [phone]  Physical Therapy Evaluation  Patient Details  Name: Juan Blake MRN: [id number] Date of Birth: 1998-05-28 Referring Provider: Sharen Heck, MD   Encounter Date: 03/19/2025  PT End of Session - 03/19/18 1032    Visit Number  1    Number of Visits  12 eval + 1x/week for 3 weeks + 2x/week for 4 weeks    Date for PT Re-Evaluation  09/18/18    Authorization Type  medicaid    PT Start Time  0935    PT Stop Time  1025    PT Time Calculation (min)  50 min    Activity Tolerance  Patient tolerated treatment well    Behavior During Therapy  [facility] for tasks assessed/performed       Past Medical History:  Diagnosis Date  . Autonomic dysreflexia   . Concussion   . Neurogenic bladder disorder   . Neurogenic bowel   . PTSD (post-traumatic stress disorder)   . Spastic paraplegia   . Spinal cord injury of T10 vertebra (HCC)   . Spinal cord injury of T9 vertebra (HCC)   . Spinal cord injury, cervical region (HCC)   . T5 spinal cord injury (HCC)   . T6 spinal cord injury (HCC)   . Testicular torsion     Past Surgical History:  Procedure Laterality Date  . CALCANEAL OSTEOTOMY W/ INTERNAL FIXATION    . FOOT SURGERY    . GASTROCNEMIUS RECESSION    . hip surgery    . KNEE SURGERY    . NECK SURGERY    . OSTEOTOMY TARSAL    . POSTERIOR FUSION CERVICAL SPINE    . TESTICLE SURGERY    . TYMPANOSTOMY TUBE PLACEMENT      Vitals:   03/19/18 0931  BP: (!) 136/94  Pulse: 75     Subjective Assessment - 03/19/18 0931    Subjective  The patient reports initial onset of hereditary spastic paraplegia in 2015 noting a progressive weakness.  The patient asks about a condition on a recent neck image that stated "reversal of lordosis"- he is worried about what this is.  PT explained that this is a description of posture, no pathological  disease process.   The patient notes he didn't sleep well last night due to neurogenic pain (even after taking 1/2 hydrocodone and gabapentin).  Patient got baclofen pump 3 years ago.    Pertinent History  Suprapubic catheter, type II diabetes, patient reports that spasms created a T9 spinal cord injury (not verified in epic/records);  he also reports autonomic dysreflexia.  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    Patient Stated Goals  "I want to try to stand again.  I know this is a big lofty goal, but I want to see if I can."  Patient notes that last standing was in 2017.    Currently in Pain?  Yes    Pain Score  -- "super uncomfortable" - I need to get out of the chair.  Unable to rate.    Pain Location  -- "everywhere"    Pain Descriptors / Indicators  -- "I don't know, it's just about to make me vomit today."    Pain Type  Neuropathic pain    Pain Onset  More than a month ago    Pain Frequency  Intermittent    Aggravating Factors  pain has been worse over the past 3 weeks    Pain Relieving Factors  medication         [facility] PT Assessment - 03/19/18 0943      Assessment   Medical Diagnosis  paraparesis, spasticity    Referring Provider  Sharen Heck, MD    Onset Date/Surgical Date  -- 2015    Prior Therapy  patient reports SCI (incomplete) happened in 2016.  He began OP therapy years ago and notes he has done therapy for years.   He reports his prior therapist doesn't think there is anything else they can do for him.        Precautions   Precautions  Fall      Restrictions   Weight Bearing Restrictions  No      Balance Screen   Has the patient fallen in the past 6 months  Yes    How many times?  1- when transfering off the toilet    Has the patient had a decrease in activity level because of a fear of falling?   Yes gets out of house in w/c    Is the patient reluctant to leave their home because of a fear of falling?   No      Home Environment   Living  Environment  Private residence    Living Arrangements  Parent    Type of Home  House    Home Access  Stairs to enter    Entrance Stairs-Number of Steps  3    Home Layout  One level    Home Equipment  Wheelchair - manual;Shower seat with power assist smart drive    Additional Comments  Bumps up on bottom and carries w/c up.  He drives with hand controls.      Prior Function   Level of Independence  Independent with community mobility with device from w/c level    Vocation  On disability    Leisure  Patient gets out of the house regularly      Cognition   Overall Cognitive Status  Within Functional Limits for tasks assessed      Sensation   Light Touch  Impaired Detail    Additional Comments  "My legs are just here", noting numbness t/o both legs.  He reports no sensation from T9 down.  He has a suprapubic catheter and is indep with bowel program.      Posture/Postural Control   Posture/Postural Control  Postural limitations    Postural Limitations  Rounded Shoulders;Forward head;Posterior pelvic tilt      Tone   Assessment Location  Right Upper Extremity;Left Upper Extremity;Right Lower Extremity;Left Lower Extremity      ROM / Strength   AROM / PROM / Strength  AROM;Strength;PROM      AROM   Overall AROM   Deficits      PROM   Overall PROM   Deficits    Overall PROM Comments  Patient does AROM shoulders to 90 degrees (when in supine and states he can't do more than this).  PT performed PROM to 160 degrees flexion and abduction and ER to [facility]. LE PROM supine - knees do not straighten with flexion maintained to 52 degrees bilaterally.  Patient showed PT a picture from last night spasms in which legs were completely crossed and fully extended that he notes was from a severe spasm.       Strength   Overall Strength  Deficits    Overall  Strength Comments  Patient initially notes he cannot move his legs, however he does IR his left hip to show PT a pressure sore on the lateral  aspect of his left shin.    UEs:  L UE pain with MMT with 4/5 shoulder flexion strength.  Did not test R UE due to patient notes pain at rest.      Transfers   Transfers  Lateral/Scoot Transfers    Lateral/Scoot Transfers  6: Modified independent (Device/Increase time)      Ambulation/Gait   Ambulation/Gait  No    Gait Comments  Unable      RUE Tone   RUE Tone  -- patient resists PROM movements      LUE Tone   LUE Tone  -- patient resists PROM movements      RLE Tone   RLE Tone  Severe unable to move against resistance      LLE Tone   LLE Tone  Severe Unable to move against resistance                Objective measurements completed on examination: See above findings.              PT Education - 03/19/18 1031    Education Details  Discussed PT emphasis on posture and strengthening in trunk/core and UEs.  He notes his legs can't be moved with stretching.  PT and patinet discussed that if prior years of therapy did not get him ambulatory, this may not be a goal of current intervention    Person(s) Educated  Patient    Methods  Explanation    Comprehension  Verbalized understanding       PT Short Term Goals - 03/19/18 1033      PT SHORT TERM GOAL #1   Title  The patient will be indep with UE strengthening program due to bilateral weakness.    Baseline  L shoulder flexion 4/5, R shoulder flexion (unable to MMT due to pain)    Time  4    Period  Weeks    Target Date  04/18/18      PT SHORT TERM GOAL #2   Title  The patient will return demo home stretching program for self stretching for LEs to help with mgmt of spasticity.    Baseline  Mother does PROM/stretching for patient.    Time  4    Period  Weeks    Target Date  04/18/18      PT SHORT TERM GOAL #3   Title  The patient will be able to obtain knee flexion to 30 degrees in supine.    Baseline  patient is maintained in 52 degrees knee flexion bilaterally.    Time  4    Period  Weeks    Target  Date  04/18/18        PT Long Term Goals - 03/19/18 1035      PT LONG TERM GOAL #1   Title  The patient will verbalize understanding of community exercise program for post d/c maintenance program.    Time  6    Period  Months    Target Date  09/18/18      PT LONG TERM GOAL #2   Title  The patient will demonstrate bilateral shoulder flexion to 160 degrees AROm for improved functional use.    Baseline  AROM to 90 degrees    Time  6    Period  Months  Target Date  09/18/18      PT LONG TERM GOAL #3   Title  The patient will return demo postural neutral in w/c for improved posture.    Baseline  Patient has rounded spine in shoulders, lumar spine    Time  6    Period  Months    Target Date  09/18/18      PT LONG TERM GOAL #4   Title  The patient will demonstrate overhead reaching from w/c level to demo increased UE strength.    Baseline  The patient cannot lift above 90 degrees in sitting today.    Time  6    Period  Months    Target Date  09/18/18             Plan - 03/19/18 1040    Clinical Impression Statement  The patient is a 27 year old male referred to OP physical therapy for paraparesis and spasticity with limitations in bilateral LE ROM, absent strength today (although he notes this fluctuates), tightness in UEs, dec'd functional use of arms for reaching activities and debilitating pain.  The patient reports his goals are to return to standing.  PT and patient discussed that he needs greater motor control to safely stand and this will only be considered if indicated.       History and Personal Factors relevant to plan of care:  PMH significant for diabetes, conversion disorder, w/c dependent, spasticity, suprapubic catheter, baclofen pump    Clinical Presentation  Evolving    Clinical Presentation due to:  worsening spasticity in UEs per report, intermittent ability to move day to day    Clinical Decision Making  Moderate    Rehab Potential  Good    Clinical  Impairments Affecting Rehab Potential  h/o extensive therapy; therefore will assess as we go to limit interventions if no progress documented    PT Frequency  -- eval + 1x/week x 3 weeks, then 2x/week for 6 months for 12 visits    PT Treatment/Interventions  ADLs/Self Care Home Management;Therapeutic exercise;Manual techniques;Passive range of motion;Functional mobility training;Wheelchair mobility training;Therapeutic activities;Patient/family education;Gait training;Neuromuscular re-education    PT Next Visit Plan  HEP for self stretching, UE strengthening/ROM, postural strengthening    Consulted and Agree with Plan of Care  Patient       Patient will benefit from skilled therapeutic intervention in order to improve the following deficits and impairments:  Abnormal gait, Impaired sensation, Impaired tone, Decreased activity tolerance, Decreased strength, Pain, Decreased balance, Decreased mobility, Increased muscle spasms, Decreased range of motion, Postural dysfunction, Impaired flexibility  Visit Diagnosis: Muscle weakness (generalized)  Spasticity  Abnormal posture  Other abnormalities of gait and mobility     Problem List Patient Active Problem List   Diagnosis Date Noted  . Psychotic disorder (HCC) 07/14/2015  . Muscle spasticity 07/08/2014  . Neurogenic bladder 07/08/2014  . Conversion disorder with mixed symptoms 07/08/2014    Verlyn Lambert, PT 03/19/2018, 10:54 AM  [facility] [address] [place], [place], [address] Phone: [phone]   Fax:  [phone]  Name: Loretha Stapleraul M Krah MRN: [id number] Date of Birth: July 15, 1991

## 2018-04-01 ENCOUNTER — Encounter: Payer: BC Managed Care – PPO | Admitting: Rehabilitative and Restorative Service Providers"

## 2018-04-04 ENCOUNTER — Ambulatory Visit: Payer: Medicaid Other | Attending: Psychiatry | Admitting: Physical Therapy

## 2018-04-04 DIAGNOSIS — R293 Abnormal posture: Secondary | ICD-10-CM | POA: Diagnosis present

## 2018-04-04 DIAGNOSIS — M6281 Muscle weakness (generalized): Secondary | ICD-10-CM

## 2018-04-04 DIAGNOSIS — R2689 Other abnormalities of gait and mobility: Secondary | ICD-10-CM | POA: Diagnosis present

## 2018-04-04 DIAGNOSIS — R252 Cramp and spasm: Secondary | ICD-10-CM | POA: Insufficient documentation

## 2018-04-04 NOTE — Patient Instructions (Addendum)
Access Code: [email or web address]  URL: [email or web address]  Date: 04/05/2018  Prepared by: Veda CanningLynn Misheel Gowans   Exercises  Butterfly Groin Stretch - 3 reps - 1 sets - 30 hold - 2x daily - 7x weekly  Long Sitting Hamstring Stretch - 3 reps - 1 sets - 30 hold - 2x daily - 7x weekly  Shoulder External Rotation and Scapular Retraction with Resistance - 10 reps - 3 sets - 5 hold - 2x daily - 7x weekly  Seated Hip External Rotation Stretch - 3 reps - 1 sets - 30 hold - 2x daily - 7x weekly

## 2018-04-05 ENCOUNTER — Encounter: Payer: Self-pay | Admitting: Physical Therapy

## 2018-04-05 NOTE — Therapy (Signed)
[facility] [address] [place], [place], [address] PhoneCrittenton [facility]: [phone]   Fax:  [phone]  Physical Therapy Treatment  Patient Details  Name: Juan Blake MRN: [id number] Date of Birth: [birth year] Referring Provider: Sharen HeckJodi Jane Hawes, MD   Encounter Date: 04/04/2018  PT End of Session - 04/05/18 1042    Visit Number  2    Number of Visits  12 eval + 1x/week for 3 weeks (current MCD approval); ?addt'l 2x/wk for 4 weeks    Date for PT Re-Evaluation  09/18/18    Authorization Type  medicaid  (CCME 3 v 7/8-7/28)    Authorization - Visit Number  1    Authorization - Number of Visits  3    PT Start Time  1017    PT Stop Time  1100    PT Time Calculation (min)  43 min    Activity Tolerance  Patient tolerated treatment well    Behavior During Therapy  [facility] assessed/performed       Past Medical History:  Diagnosis Date  . Autonomic dysreflexia   . Concussion   . Neurogenic bladder disorder   . Neurogenic bowel   . PTSD (post-traumatic stress disorder)   . Spastic paraplegia   . Spinal cord injury of T10 vertebra (HCC)   . Spinal cord injury of T9 vertebra (HCC)   . Spinal cord injury, cervical region (HCC)   . T5 spinal cord injury (HCC)   . T6 spinal cord injury (HCC)   . Testicular torsion     Past Surgical History:  Procedure Laterality Date  . CALCANEAL OSTEOTOMY W/ INTERNAL FIXATION    . FOOT SURGERY    . GASTROCNEMIUS RECESSION    . hip surgery    . KNEE SURGERY    . NECK SURGERY    . OSTEOTOMY TARSAL    . POSTERIOR FUSION CERVICAL SPINE    . TESTICLE SURGERY    . TYMPANOSTOMY TUBE PLACEMENT      There were no vitals filed for this visit.  Subjective Assessment - 04/04/18 1023    Subjective  Reports they have taken a culture of place on his neck due to ?infection. Started on antibiotics. Has a place on each arm, back of neck and left lower leg that is bandaged.     Pertinent History   Suprapubic catheter, type II diabetes, patient reports that spasms created a T9 spinal cord injury (not verified in epic/records);  he also reports autonomic dysreflexia.  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    Patient Stated Goals  "I want to try to stand again.  I know this is a big lofty goal, but I want to see if I can."  Patient notes that last standing was in 2017.    Currently in Pain?  Other (Comment) I don't know if I'm having pain unless I check my BP        Treatment-  Patient transferred w/c to mat table and into propped long-sitting (pillows behind back against the wall) with supervision and cues.   Educated in and performed the HEP listed below.   Transfer back to w/c from mat with min assist to stabilize his wheelchair as it was partially pivoting on front wheels as he attempted transfer. Pt reported fatigue and  "not a good day" when requested assist for transfer.     Access Code: [email or web address]  URL: [email or web address]  Date: 04/05/2018  Prepared by: Veda CanningLynn Nehemie Casserly  Exercises  Butterfly Groin Stretch - 3 reps - 1 sets - 30 hold - 2x daily - 7x weekly  Long Sitting Hamstring Stretch - 3 reps - 1 sets - 30 hold - 2x daily - 7x weekly  Shoulder External Rotation and Scapular Retraction with Resistance - 10 reps - 3 sets - 5 hold - 2x daily - 7x weekly  Seated Hip External Rotation Stretch - 3 reps - 1 sets - 30 hold - 2x daily - 7x weekly                        PT Education - 04/04/18 1700    Education Details  Discussed Medicaid coverage, limited visits, and updated his appointments to fit within [facility] approval dates. Discussed due to limited coverage, plan to focus on self-stretching techniques to help him manage his spasticity and UE strengthening HEP. Provided HEP    Person(s) Educated  Patient    Methods  Explanation;Demonstration;Verbal cues pt requested no handout but have it emailed to him    Comprehension   Verbalized understanding;Returned demonstration;Need further instruction       PT Short Term Goals - 03/19/18 1033      PT SHORT TERM GOAL #1   Title  The patient will be indep with UE strengthening program due to bilateral weakness.    Baseline  L shoulder flexion 4/5, R shoulder flexion (unable to MMT due to pain)    Time  4    Period  Weeks    Target Date  04/18/18      PT SHORT TERM GOAL #2   Title  The patient will return demo home stretching program for self stretching for LEs to help with mgmt of spasticity.    Baseline  Mother does PROM/stretching for patient.    Time  4    Period  Weeks    Target Date  04/18/18      PT SHORT TERM GOAL #3   Title  The patient will be able to obtain knee flexion to 30 degrees in supine.    Baseline  patient is maintained in 52 degrees knee flexion bilaterally.    Time  4    Period  Weeks    Target Date  04/18/18        PT Long Term Goals - 03/19/18 1035      PT LONG TERM GOAL #1   Title  The patient will verbalize understanding of community exercise program for post d/c maintenance program.    Time  6    Period  Months    Target Date  09/18/18      PT LONG TERM GOAL #2   Title  The patient will demonstrate bilateral shoulder flexion to 160 degrees AROm for improved functional use.    Baseline  AROM to 90 degrees    Time  6    Period  Months    Target Date  09/18/18      PT LONG TERM GOAL #3   Title  The patient will return demo postural neutral in w/c for improved posture.    Baseline  Patient has rounded spine in shoulders, lumar spine    Time  6    Period  Months    Target Date  09/18/18      PT LONG TERM GOAL #4   Title  The patient will demonstrate overhead reaching from w/c level to demo increased UE strength.    Baseline  The patient cannot lift above 90 degrees in sitting today.    Time  6    Period  Months    Target Date  09/18/18            Plan - 04/05/18 1045    Clinical Impression Statement   Discussed goals of POC as he was only approved for 3 visits by his insurance. Patient agreed to goals and POC. Session focused on establishing HEP for self-stretching his legs and initiating UE sttrengthening program.     Rehab Potential  Good    Clinical Impairments Affecting Rehab Potential  h/o extensive therapy; therefore will assess as we go to limit interventions if no progress documented    PT Frequency  -- eval + 1x/week x 3 weeks, then 2x/week for 6 months for 12 visits    PT Treatment/Interventions  ADLs/Self Care Home Management;Therapeutic exercise;Manual techniques;Passive range of motion;Functional mobility training;Wheelchair mobility training;Therapeutic activities;Patient/family education;Gait training;Neuromuscular re-education    PT Next Visit Plan  Be sure he could access his HEP via MedBridge (he deferred printed copy, but probably should give him one??) Update/additions to HEP for self stretching, UE strengthening/ROM, postural strengthening    Consulted and Agree with Plan of Care  Patient       Patient will benefit from skilled therapeutic intervention in order to improve the following deficits and impairments:  Abnormal gait, Impaired sensation, Impaired tone, Decreased activity tolerance, Decreased strength, Pain, Decreased balance, Decreased mobility, Increased muscle spasms, Decreased range of motion, Postural dysfunction, Impaired flexibility  Visit Diagnosis: Spasticity  Muscle weakness (generalized)     Problem List Patient Active Problem List   Diagnosis Date Noted  . Psychotic disorder (HCC) 07/14/2015  . Muscle spasticity 07/08/2014  . Neurogenic bladder 07/08/2014  . Conversion disorder with mixed symptoms 07/08/2014    Zena Amos, PT 04/05/2018, 10:48 AM  [facility] [address] [place], [place], [address] Phone: [phone]   Fax:  [phone]  Name: FERNADO Blake MRN:  [id number] Date of Birth: 28-Aug-1991

## 2018-04-08 ENCOUNTER — Ambulatory Visit: Payer: Medicaid Other | Admitting: Rehabilitative and Restorative Service Providers"

## 2018-04-08 ENCOUNTER — Encounter: Payer: Self-pay | Admitting: Rehabilitative and Restorative Service Providers"

## 2018-04-08 DIAGNOSIS — R252 Cramp and spasm: Secondary | ICD-10-CM | POA: Diagnosis not present

## 2018-04-08 DIAGNOSIS — M6281 Muscle weakness (generalized): Secondary | ICD-10-CM

## 2018-04-08 DIAGNOSIS — R2689 Other abnormalities of gait and mobility: Secondary | ICD-10-CM

## 2018-04-08 DIAGNOSIS — R293 Abnormal posture: Secondary | ICD-10-CM

## 2018-04-08 NOTE — Therapy (Addendum)
[facility] [address] [place], [place], [address] Phone: [phone]   Fax:  [phone]  Physical Therapy Treatment  Patient Details  Name: Juan Blake MRN: [id number] Date of Birth: 11/14/1990 Referring Provider: Kem Boroughs, MD   Encounter Date: 04/08/2018  PT End of Session - 04/08/18 1120    Visit Number  3    Number of Visits  12 eval + 1x/week for 3 weeks (current MCD approval); ?addt'l 2x/wk for 4 weeks    Date for PT Re-Evaluation  09/18/18    Authorization Type  medicaid  (CCME 3 v 7/8-7/28)    Authorization - Visit Number  2    Authorization - Number of Visits  3    PT Start Time  1111    PT Stop Time  1200    PT Time Calculation (min)  49 min    Activity Tolerance  Patient tolerated treatment well    Behavior During Therapy  [facility] for tasks assessed/performed       Past Medical History:  Diagnosis Date  . Autonomic dysreflexia   . Concussion   . Neurogenic bladder disorder   . Neurogenic bowel   . PTSD (post-traumatic stress disorder)   . Spastic paraplegia   . Spinal cord injury of T10 vertebra ([place])   . Spinal cord injury of T9 vertebra ([place])   . Spinal cord injury, cervical region ([place])   . T5 spinal cord injury ([place])   . T6 spinal cord injury ([place])   . Testicular torsion     Past Surgical History:  Procedure Laterality Date  . CALCANEAL OSTEOTOMY W/ INTERNAL FIXATION    . FOOT SURGERY    . GASTROCNEMIUS RECESSION    . hip surgery    . KNEE SURGERY    . NECK SURGERY    . OSTEOTOMY TARSAL    . POSTERIOR FUSION CERVICAL SPINE    . TESTICLE SURGERY    . TYMPANOSTOMY TUBE PLACEMENT      There were no vitals filed for this visit.  Subjective Assessment - 04/08/18 1115    Subjective  The patient reports he has had a weekend of late nights due to concert.  Exercises are "making me tired."  "So, can you explain to me why you don't think more therapy will be covered by medicaid?"   The patient notes he doesn't need a written copy of HEP because he has a large iPAD and could access HEP on it.   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    Pertinent History  Suprapubic catheter, type II diabetes, patient reports that spasms created a T9 spinal cord injury (not verified in epic/records);  he also reports autonomic dysreflexia.  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    Patient Stated Goals  "I want to try to stand again.  I know this is a big lofty goal, but I want to see if I can."  Patient notes that last standing was in 2017.    Currently in Pain?  No/denies doesn't know if he's in pain unless he checks BP                       [facility] Adult PT Treatment/Exercise - 04/08/18 1122      Transfers   Transfers  Lateral/Scoot Transfers    Lateral/Scoot Transfers  6: Modified independent (Device/Increase time)    Comments  Moving from mat>return to w/c  at end of session, the patient used a sliding board due to reporting 10/10 pain after stretching right arm.        Self-Care   Self-Care  Other Self-Care Comments    Other Self-Care Comments   discussed pain mgmt and modification to home program.  PT also discussed postural alignment for stretching and seated scapular strengthening.  Recommended ice pack 2 times/day for 20 minutes.       Exercises   Exercises  Knee/Hip;Shoulder      Knee/Hip Exercises: Seated   Other Seated Knee/Hip Exercises  seated hip ER    Other Seated Knee/Hip Exercises  Patient unable to bring R ankle to L knee in sitting.  He did not do this at home due to the sideguards in his wheelchair       Shoulder Exercises: Supine   Protraction  --    Other Supine Exercises  Supine stretching with passive overpressure performing flexion, abduction, ER/IR.  The patient's ROM was more limited today than at eval with significant chest muscle (pectoralis) guarding.  PT did some contract/relax and then performed passive overpressure  into abduction to 90 degrees, flexion only to 80 degrees, attempted patient reaching behind head (abduction with ER) and then performed IR/ER with shoulder at neutral.  Patient stated "this is the worst my shoulder has ever felt". PT offered ice pack to reduce pain and recommended he f/u with MD if pain continued at higher level.  The patient was able to sit comfortably scrolling through news on his phone without distress, hwoever when asked, he reports 10/10 pain continues.                PT Short Term Goals - 03/19/18 1033      PT SHORT TERM GOAL #1   Title  The patient will be indep with UE strengthening program due to bilateral weakness.    Baseline  L shoulder flexion 4/5, R shoulder flexion (unable to MMT due to pain)    Time  4    Period  Weeks    Target Date  04/18/18      PT SHORT TERM GOAL #2   Title  The patient will return demo home stretching program for self stretching for LEs to help with mgmt of spasticity.    Baseline  Mother does PROM/stretching for patient.    Time  4    Period  Weeks    Target Date  04/18/18      PT SHORT TERM GOAL #3   Title  The patient will be able to obtain knee flexion to 30 degrees in supine.    Baseline  patient is maintained in 52 degrees knee flexion bilaterally.    Time  4    Period  Weeks    Target Date  04/18/18        PT Long Term Goals - 03/19/18 1035      PT LONG TERM GOAL #1   Title  The patient will verbalize understanding of community exercise program for post d/c maintenance program.    Time  6    Period  Months    Target Date  09/18/18      PT LONG TERM GOAL #2   Title  The patient will demonstrate bilateral shoulder flexion to 160 degrees AROm for improved functional use.    Baseline  AROM to 90 degrees    Time  6    Period  Months    Target Date  09/18/18      PT LONG TERM GOAL #3   Title  The patient will return demo postural neutral in w/c for improved posture.    Baseline  Patient has rounded spine in  shoulders, lumar spine    Time  6    Period  Months    Target Date  09/18/18      PT LONG TERM GOAL #4   Title  The patient will demonstrate overhead reaching from w/c level to demo increased UE strength.    Baseline  The patient cannot lift above 90 degrees in sitting today.    Time  6    Period  Months    Target Date  09/18/18            Plan - 04/08/18 1155    Clinical Impression Statement  PT made small modification to Hip ER stretch.  Then began focusing more on UE use (right side more limited).  PT attepted to do PROM with hopes of giving AAROM for home, however patient did not respond well to stretching and noted 10/10 pain.      PT Treatment/Interventions  ADLs/Self Care Home Management;Therapeutic exercise;Manual techniques;Passive range of motion;Functional mobility training;Wheelchair mobility training;Therapeutic activities;Patient/family education;Gait training;Neuromuscular re-education    PT Next Visit Plan  Add AAROM with patient moving his own UE at table with washcloth, postural strengthening, review LE stretches as needed.     Consulted and Agree with Plan of Care  Patient       Patient will benefit from skilled therapeutic intervention in order to improve the following deficits and impairments:  Abnormal gait, Impaired sensation, Impaired tone, Decreased activity tolerance, Decreased strength, Pain, Decreased balance, Decreased mobility, Increased muscle spasms, Decreased range of motion, Postural dysfunction, Impaired flexibility  Visit Diagnosis: Spasticity  Muscle weakness (generalized)  Abnormal posture  Other abnormalities of gait and mobility    PHYSICAL THERAPY DISCHARGE SUMMARY  Visits from Start of Care: 3   *The patient did not return after this visit. He had called our clinic due to a shoulder injury.   Plan: Patient agrees to discharge.  Patient goals were not met. Patient is being discharged due to a change in medical status.  ?????          ADDENDED ON 11/14/2018 Leyah Bocchino, PT   Problem List Patient Active Problem List   Diagnosis Date Noted  . Psychotic disorder ([place]) 07/14/2015  . Muscle spasticity 07/08/2014  . Neurogenic bladder 07/08/2014  . Conversion disorder with mixed symptoms 07/08/2014    Aashi Derrington , PT 04/08/2018, 11:58 AM  [facility] [address] [place], [place], [address] Phone: [phone]   Fax:  [phone]  Name: Juan Blake MRN: [id number] Date of Birth: 10/07/1990

## 2018-04-15 ENCOUNTER — Ambulatory Visit: Payer: BC Managed Care – PPO | Admitting: Rehabilitative and Restorative Service Providers"

## 2018-04-19 ENCOUNTER — Ambulatory Visit: Payer: BC Managed Care – PPO | Admitting: Physical Therapy

## 2018-04-22 ENCOUNTER — Ambulatory Visit: Payer: BC Managed Care – PPO | Admitting: Rehabilitative and Restorative Service Providers"

## 2018-04-29 ENCOUNTER — Ambulatory Visit: Payer: BC Managed Care – PPO | Admitting: Rehabilitative and Restorative Service Providers"

## 2023-04-20 ENCOUNTER — Encounter: Payer: Self-pay | Admitting: Physical Medicine and Rehabilitation

## 2023-05-18 ENCOUNTER — Encounter: Payer: 59 | Attending: Physical Medicine and Rehabilitation | Admitting: Physical Medicine and Rehabilitation

## 2023-05-18 ENCOUNTER — Encounter: Payer: Self-pay | Admitting: Physical Medicine and Rehabilitation

## 2023-05-18 VITALS — BP 141/88 | HR 79 | Ht 67.0 in | Wt 200.0 lb

## 2023-05-18 DIAGNOSIS — N319 Neuromuscular dysfunction of bladder, unspecified: Secondary | ICD-10-CM | POA: Insufficient documentation

## 2023-05-18 DIAGNOSIS — Z978 Presence of other specified devices: Secondary | ICD-10-CM | POA: Diagnosis not present

## 2023-05-18 DIAGNOSIS — M62838 Other muscle spasm: Secondary | ICD-10-CM | POA: Insufficient documentation

## 2023-05-18 DIAGNOSIS — G114 Hereditary spastic paraplegia: Secondary | ICD-10-CM | POA: Insufficient documentation

## 2023-05-18 MED ORDER — NITROGLYCERIN 0.3 MG SL SUBL: 0.3000 mg | SUBLINGUAL_TABLET | SUBLINGUAL | 5 refills | Status: DC | PRN

## 2023-05-18 NOTE — Patient Instructions (Addendum)
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  Here for evaluation of SCI  Take over ITB pump- managing it. At 1116 mcg day- and 6 x 60 mcg bolus- /day. Has to be refilled by October 2nd at the latest. Has [employer] and [place].   Needs pump filled before or on 9/25- then should get done before shoulder surgery 9/30 AND before pump refill date 10/2.   Needs to be titrated up some at refill date.    2. Usually gets Botox- was getting in multiple locations  Pec major 20 units B/L- and L pec minor 35 units-  Rectus femoris 15 units B/L Adductor longus 15 units Adductor magnus 15 units B/L Medial Hamstring 27.5 B/L Lateral hamstrings 27.5 B/L  270 units injected in total.  Last done 03/02/23 So needs to figure out with Urology- next appt 8/28- let us know after 9/7- bladder botox is usually q6-12 months.   3.    Having arthroscopic surgery on L shoulder and bone spur shaved (cutting into RTC).   4. Change SPC self on before 9/30- so doesn't have to do right after surgery.    5. Fax number [phone]-    6. If cannot figure out cause, Metoprolol is fine, but if you know it will go away in <30 minutes- Nitroglycerin sublingual- will usually last 15 minutes- and then would need ot take again, if lasts longer. Use Dark non see through bottle to carry-don't use more than 3x/in 24 hour period-     7. W/c is 75-83 years old- smart drive power assist- is ROHO cushion- needs a new cushion- Garment/textile technologist is ordering- via [employer]- [facility] - Neuro has ordered.   8. Takes oral Baclofen as needed for spasticity- no other meds for spasticity except Botox and ITB pump.  Doesn't need refills on Baclofen.   9.  Eat something BEFORE bowel program- it works better!   10. F/U in 3months for double appt-  Needs appt in September after  9/7 for Botox by Dr Shearon Stalls Can most likely take over ITB pump- but cnanot compound Baclofen for him- would need to manage on 1000 mcg or 2000 mcg  11. Dr Samson Frederic is his [facility]- replaced ITB pump this year.

## 2023-05-18 NOTE — Progress Notes (Signed)
Subjective:    Patient ID: Juan Blake, male    DOB: 21-Oct-1990, 32 y.o.   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  Here for evaluation of SCI  Come out with 16 new genes for HSP- looking into new genes to see if meets criteria.    Has SPC- changes on own- mother cna help but can do himself.  Flip flow valve- so can pee/empty bladder every 4-5 hours- no bag  AD sets off when needs to get up and empty SP tube.   Has Baclofen pump- Dr Sherral Hammers- at [facility]- forgotten to order meds 2x. Last refilled-  Has dose gives to self bolus dosing with medtronic-  1116 mg/day And 60 mg/per bolus- 6x/day. Can use.  Usually filled every 3 months- due October 2nd.   Also takes Oral Baclofen- esp going into cold weather.   Last got Botox June 2024. Dr Sherral Hammers giving Botox   Urology also does Botox- thinks gets 200 units.    Cannot with forearm crutches- 100 ft or so.  But some days cannot.   Going to [facility] 8/27- and will get [facility] info .    Bowel- does dig stim- suppository on occasion, but dig stim every other day-  in AM  before breakfast bowel program     Has AD ~ 2x/day- treats with metoprolol.  Has associated HA and sweating above lesion.  No sense of impending doom.  (+) nasal congestion.    Doesn't think Baclofen as efficient as used to be- 20 mg 5x/day- as needed!   Having L shoulder surgery- spasticity makes it worse.  9/30- also needs surgery on B/L knees (mainly R knee) and R foot (wearing CAM type boot on R side)   Social Hx: Acupuncturist.  Disabled Drives with hand control Has salt water tanks 30 glalons   Pain Inventory Average Pain 0 Pain Right Now 0 My pain is  no pain  In the last 24 hours, has pain interfered with the following? General  activity 0 Relation with others 0 Enjoyment of life 0 What TIME of day is your pain at its worst? varies Sleep (in general) Fair  Pain is worse with:  heart related Pain improves with:  unsure Relief from Meds:  medication  Mobility Wheelchair Does not climb stairs Does drive Does not have any goals in this area  Function Disabled  No problems in this area  no  New patient evaluation    No family history on file. Social History   Socioeconomic History   Marital status: Single    Spouse name: Not on file   Number of children: Not on file   Years of education: Not on file   Highest education level: Not on file  Occupational History   Not on file  Tobacco Use   Smoking status: Never   Smokeless tobacco: Never  Substance and Sexual Activity   Alcohol use: No   Drug use: No   Sexual activity: Never  Other Topics Concern   Not on file  Social History Narrative   Not on file   Social Determinants of Health   Financial Resource Strain: Low Risk  (06/26/2022)   Received from [facility]   Overall Financial Resource Strain (CARDIA)    Difficulty of Paying Living Expenses: Not  hard at all  Food Insecurity: Low Risk  (09/15/2022)   Received from [facility] visits prior to 11/25/2022.   Food    Within the past 12 months, you worried that your food would run out before you got money to buy more food: Never true    Within the past 12 months, the food you bought just didn't last and you didn't have money to get more: Never true  Transportation Needs: No Transportation Needs (09/15/2022)   Received from [facility] visits prior to 11/25/2022.   Transportation    In the past 12 months, has lack of reliable transportation kept you from medical appointments, meetings, work or from getting things needed for daily living?: No  Physical Activity: Inactive (06/26/2022)   Received from [facility]   Exercise Vital Sign    Days of  Exercise per Week: 0 days    Minutes of Exercise per Session: 90 min  Stress: No Stress Concern Present (06/26/2022)   Received from [facility] - Occupational Stress Questionnaire    Feeling of Stress : Not at all  Social Connections: Socially Integrated (06/26/2022)   Received from [facility]   Social Network    How would you rate your social network (family, work, friends)?: Good participation with social networks   Past Surgical History:  Procedure Laterality Date   CALCANEAL OSTEOTOMY W/ INTERNAL FIXATION     FOOT SURGERY     GASTROCNEMIUS RECESSION     hip surgery     KNEE SURGERY     NECK SURGERY     OSTEOTOMY TARSAL     POSTERIOR FUSION CERVICAL SPINE     TESTICLE SURGERY     TYMPANOSTOMY TUBE PLACEMENT     Past Medical History:  Diagnosis Date   Autonomic dysreflexia    Concussion    Neurogenic bladder disorder    Neurogenic bowel    PTSD (post-traumatic stress disorder)    Spastic paraplegia    Spinal cord injury of T10 vertebra (HCC)    Spinal cord injury of T9 vertebra (HCC)    Spinal cord injury, cervical region (HCC)    T5 spinal cord injury (HCC)    T6 spinal cord injury (HCC)    Testicular torsion    BP (!) 141/88   Pulse 79   Ht 5\' 7"  (1.702 m)   Wt 200 lb (90.7 kg)   SpO2 93%   BMI 31.32 kg/m   Opioid Risk Score:   Fall Risk Score:  `1  Depression screen PHQ 2/9      No data to display            Review of Systems  All other systems reviewed and are negative.      Objective:   Physical Exam  Awake, alert, appropriate, in manual w/c. Very interactive, NAD In manual power assist w/c  MS: Deltoids 4-/5 wors eon L Biceps 5-/5 B/L Triceps 4+ to 5-/5- worse on L  WE 5/5 B/L Grip 5-/5 B/L FA 5-/5 B/L HF 2-/5 B/L- L slightly better KE- 2+/5 on L; 2+/5 on R DF on L 3-/5; R  3-/5 PF- L 2/5; R 2-/5   Wearing walking boot on R foot  Neuro: No hoffman's B/L A little tight in  shoulders, but not sure it's tone- because no increased tone in Ue's otherwise Hips MAS of 3-4 B/L- esp adduction/abduction Knees MAS of 3-4 in spite  of ITB pump/Botox  Ankles MAS of 3- no clonus Doesn't have increased DTRs at patella or Achilles B/L   Shear abrasion on R anterior shin in middle- small     Assessment & Plan:   Patient is a 32 yr old L handed male with 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  Here for evaluation of SCI  Take over ITB pump- managing it. At 1116 mcg day- and 6 x 60 mcg bolus- /day. Has to be refilled by October 2nd at the latest. Has [employer] and [place].   Needs pump filled before or on 9/25- then should get done before shoulder surgery 9/30 AND before pump refill date 10/2.   Needs to be titrated up some at refill date.    2. Usually gets Botox- was getting in multiple locations  Pec major 20 units B/L- and L pec minor 35 units-  Rectus femoris 15 units B/L Adductor longus 15 units Adductor magnus 15 units B/L Medial Hamstring 27.5 B/L Lateral hamstrings 27.5 B/L  270 units injected in total.  Last done 03/02/23 So needs to figure out with Urology- next appt 8/28- let us know after 9/7- bladder botox is usually q6-12 months.   3.    Having arthroscopic surgery on L shoulder and bone spur shaved (cutting into RTC).   4. Change SPC self on before 9/30- so doesn't have to do right after surgery.    5. Fax number [phone]-    6. If cannot figure out cause, Metoprolol is fine, but if you know it will go away in <30 minutes- Nitroglycerin sublingual- will usually last 15 minutes- and then would need ot take again, if lasts longer. Use Dark non see through bottle to carry-    7. W/c is 24-43 years old- smart drive power assist- is ROHO cushion- needs a new cushion- Garment/textile technologist is ordering- via [employer]- Ben -  Neuro has ordered.   8. Takes oral Baclofen as needed for spasticity- no other meds for spasticity except Botox and ITB pump.  Doesn't need refills on Baclofen.   9.  Eat something BEFORE bowel program- it works better!   10. F/U in 3months for double appt-  Needs appt in September after 9/7 for Botox by Dr Shearon Stalls Can most likely take over ITB pump- but cnanot compound Baclofen for him- would need to manage on 1000 mcg or 2000 mcg  11. Dr Samson Frederic is his [facility]- replaced ITB pump this year.    I spent a total of 63   minutes on total care today- >50% coordination of care- due to education on AD, ITB pump, Botox, management of Bowel program and Urologiy botox vs regular botox for spasticity.

## 2023-06-01 ENCOUNTER — Telehealth: Payer: Self-pay | Admitting: *Deleted

## 2023-06-01 NOTE — Telephone Encounter (Signed)
[employer] checking on status of order for wheelchair repair. Paperwork received and in provider box pending completion.

## 2023-06-04 ENCOUNTER — Telehealth: Payer: Self-pay | Admitting: Physical Medicine and Rehabilitation

## 2023-06-04 NOTE — Telephone Encounter (Signed)
Patient notified and Sept appt cancelled.

## 2023-06-04 NOTE — Telephone Encounter (Signed)
Patient called in and wants to confirm his appointment for pump refill on Monday 9/16, states he talked to Dr Berline Chough and not sure if she was going to be able to refill his pump and just wants to confirm

## 2023-06-11 ENCOUNTER — Encounter: Payer: 59 | Admitting: Physical Medicine and Rehabilitation

## 2023-06-20 ENCOUNTER — Encounter: Payer: Self-pay | Admitting: Physical Medicine and Rehabilitation

## 2023-07-06 ENCOUNTER — Ambulatory Visit: Payer: BC Managed Care – PPO | Admitting: Physical Medicine and Rehabilitation

## 2023-07-17 ENCOUNTER — Encounter: Payer: Self-pay | Admitting: Physical Medicine & Rehabilitation

## 2023-07-17 ENCOUNTER — Encounter: Payer: 59 | Attending: Physical Medicine and Rehabilitation | Admitting: Physical Medicine & Rehabilitation

## 2023-07-17 VITALS — BP 146/88 | HR 83 | Ht 67.0 in | Wt 194.0 lb

## 2023-07-17 DIAGNOSIS — G114 Hereditary spastic paraplegia: Secondary | ICD-10-CM | POA: Insufficient documentation

## 2023-07-17 MED ORDER — SODIUM CHLORIDE (PF) 0.9 % IJ SOLN
6.0000 mL | Freq: Once | INTRAMUSCULAR | Status: AC
  Administered 2023-07-17: 6 mL

## 2023-07-17 MED ORDER — ONABOTULINUMTOXINA 100 UNITS IJ SOLR
300.0000 [IU] | Freq: Once | INTRAMUSCULAR | Status: AC
Start: 2023-07-17 — End: 2023-07-17
  Administered 2023-07-17: 300 [IU] via INTRAMUSCULAR

## 2023-07-17 NOTE — Progress Notes (Signed)
   Botox Injection for spasticity using needle EMG guidance  Dilution: 50 Units/ml Indication: Severe spasticity which interferes with ADL,mobility and/or  hygiene and is unresponsive to medication management and other conservative care Informed consent was obtained after describing risks and benefits of the procedure with the patient. This includes bleeding, bruising, infection, excessive weakness, or medication side effects. A REMS form is on file and signed. Needle: 27g 1" needle electrode Number of units per muscle  Pec Major 25U B   Rectus fem 25U B Adductor longus 25 units B  Adductor magnus 25 units B Medial Hamstring 25 B/L Lateral hamstrings 25 B/L  All injections were done after obtaining appropriate EMG activity and after negative drawback for blood. The patient tolerated the procedure well. Post procedure instructions were given. A followup appointment was made.    Consider not injecting Pec to enable thoraci paraspinal injectio ~T12

## 2023-07-31 ENCOUNTER — Ambulatory Visit (INDEPENDENT_AMBULATORY_CARE_PROVIDER_SITE_OTHER): Payer: 59 | Admitting: Medical Genetics

## 2023-07-31 VITALS — BP 135/91 | HR 74 | Ht 67.0 in | Wt 191.0 lb

## 2023-07-31 DIAGNOSIS — F909 Attention-deficit hyperactivity disorder, unspecified type: Secondary | ICD-10-CM | POA: Diagnosis not present

## 2023-07-31 DIAGNOSIS — H9313 Tinnitus, bilateral: Secondary | ICD-10-CM

## 2023-07-31 DIAGNOSIS — Z978 Presence of other specified devices: Secondary | ICD-10-CM

## 2023-07-31 DIAGNOSIS — N319 Neuromuscular dysfunction of bladder, unspecified: Secondary | ICD-10-CM

## 2023-07-31 DIAGNOSIS — M62838 Other muscle spasm: Secondary | ICD-10-CM

## 2023-07-31 NOTE — Patient Instructions (Signed)
Proband-only exome sequencing through [facility] - results expected in 1-2 months. Continue follow up with current medical providers per their recommendations.   Follow up in genetics clinic will be based on the results of the testing.  Thank you for allowing [place] to be a part of your care. Please let us know if there is anything else you need from [place].  [facility] Team

## 2023-07-31 NOTE — Progress Notes (Unsigned)
GENETIC COUNSELING NEW PATIENT EVALUATION Patient name: Juan Blake DOB: 12-06-1990 Age: 32 y.o. MRN: [id number]  Referring Provider/Specialty: Merlinda Frederick, MD, [facility] Date of Evaluation: 07/31/2023 Chief Complaint/Reason for Referral: Spasticity   Brief Summary: Juan Blake is a 32 y.o. male who presents today for an initial genetics evaluation for spasticity. He is unaccompanied at today's visit.  Prior genetic testing has been performed.  Juan Blake previously had negative genetic testing for Hereditary Spastic Paraplegia (HSP).  In 2015, his testing was performed by [facility] and was reportedly negative.  In 2023, his testing was performed by [facility] and was also reportedly negative.  These tests were not available for review.   Family History: Juan Blake was removed from his biological parents as a child and has no contact.  He believes his mother is deceased. He is aware of three brothers, one of whom is deceased from suicide. He believes some or all of his siblings have some degree of a learning disability. He is not aware of any individuals with similar symptoms of spasticity.   Prior Genetic testing: 2015, [facility]: Hereditary Spastic Paraplegia Panel - reportedly negative. 2023, [facility]: Hereditary Spastic Paraplegis Panel - reportedly negative.  Results not available for review.  Genetic Counseling: Juan Blake is a 32 y.o. male with spasticity.  Juan Blake reported that his symptoms began in high school with new onset clumsiness.  Soon after, this clumsiness progressed into worsening spasticity of his legs, shoulders, and back.  Around this time, he was also diagnosed with neurogenic bladder.  Juan Blake has seen several neurologists at [facility], [facility], and is now followed at [facility].  He currently uses a wheelchair for ambulation but it able to walk short distances on crutches.  He also utilizes a baclofen pump and regular  Botox injections to manage his symptoms.  Juan Blake has received genetic testing in the past.  In 2015, the [facility] Hereditary Spastic Paraplegia panel was performed and was reportedly negative.  In 2023, the [facility] Hereditary Spastic Paraplegia panel was performed and was reportedly negative.  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  Juan Blake was adopted and knows little about his biological family.  He is not aware of any other individuals with similar symptoms.  Given his complex medical history and previously negative genetic testing, it is reasonable to consider broader genetic testing, specifically Whole Exome Sequencing (WES).  Genetic considerations were reviewed Juan Blake. He is aware that we have over 20,000 genes, each with an important role in the body. All of the genes are packaged into structures called chromosomes. We have two copies of every chromosome- one that is inherited from each parent- and thus two copies of every gene. Given Juan Blake features, concern for a genetic cause of his symptoms has arisen. If a specific genetic abnormality can be identified, it may help provide further insight into prognosis, management, and recurrence risk.  Whole exome sequencing assesses all of the coding regions (exons) of the genes for any variants that could be associated with an individual's symptoms. It allows for a broad approach that may capture genetic variants and conditions not previously identified on prior genetic testing.  Juan Blake is interested in pursuing this testing today and would like to know of secondary findings as well. The consent form,  possible results (positive, negative, and variant of uncertain significance), and expected timeline were reviewed.  A buccal  sample was collected today from Juan Blake to be sent to [facility] for analysis.  Results typically take 1-2 months to return, at which point we will reach out with more information.  Recommendations: [facility] Whole Exome Sequencing (WES). Continue follow-up with other healthcare providers as recommended  Date: 08/01/2023 Time: 11:39 Total time spent: 60 minutes  Lambert Mody MS [facility] Certified Genetic Counselor [facility]  I have personally counseled the patient/family, spending > 50% of total time on counseling and coordination of care as outlined.

## 2023-07-31 NOTE — Progress Notes (Unsigned)
MEDICAL GENETICS NEW PATIENT EVALUATION  Patient name: Juan Blake DOB: 1991-04-13 Age: 32 y.o. MRN: [id number]  Referring Provider/Specialty: Merlinda Frederick, MD, [facility] Date of Evaluation: 07/31/2023 Chief Complaint/Reason for Referral: Spasticity  Assessment: We discussed with [name] that it is possible he could have a genetic cause to his clinical features that was not identified on his prior genetic testing. Given he has multiple medical and developmental concerns that are congenital in nature (spasticity, ADHD, autism, developmental/learning disability, etc), appropriate testing to consider at this time would be exome sequencing, which analyzes thousands of genes simultaneously. [name] was interested in this being performed, and consent and samples were obtained for a proband-only exome sequencing study through [facility]. Results will be available in 1-2 months, and we will contact him when they return. He should otherwise continue follow up with his current providers per their recommendations.  Recommendations: Proband-only exome sequencing through [facility] - results expected in 1-2 months. Continue follow up with current medical providers per their recommendations.  Follow up in genetics clinic will be based on the results of the testing.   HPI: Juan Blake is a 32 y.o. assigned male at birth who presents today for an initial genetics evaluation for spasticity. He provided the history. This information, along with a review of pertinent records, labs, and radiology studies, is summarized below.  [name] noticed when he was in high school that he was becoming more clumsy. He also had an issue with being unable to urinate, and he was diagnosed with a neurogenic bladder. After he graduated high school, he started developing spasticity and tightness in his arms, legs, and back. He started seeing neurologists, eventually ending up at [facility]. He had a baclofen pump  implanted around age 67, with replacement at age 60. He feels some improvement in his symptoms, but that his spasticity is worsened with some weather changes. He also received Botox injections through a PM&R provider. More recently [name] feels that his spasticity is worsening and becoming less responsive to his treatment. He has physical therapy regularly for his tightness. His spasticity has led to some orthopedic issues involving his shoulders, hips, and knees. He also wears braces for his joints as well. He is also followed by an orthopedic provider for these issues.  [name] has had genetic testing for various forms of hereditary spastic paraplegia in 2015 and 2023 through his neurologist. These were reported as normal/negative per [name] and as mentioned in his records.  Additional medical concerns include a spinal cord injury in his thoracic area, cervical spine fusion, neurogenic bowel/bladder requiring stimulation and catheterization, prediabetes, intermittent tinnitus, ADHD, high functioning autism spectrum disorder, and developmental/learning disability. He does not think he has an intellectual disability. 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 It has also been discussed with [name] that he may have a conversion disorder.  Past Medical History: Past Medical History:  Diagnosis Date   Autonomic dysreflexia    Concussion    Neurogenic bladder disorder    Neurogenic bowel    PTSD (post-traumatic stress disorder)    Spastic paraplegia    Spinal cord injury of T10 vertebra (HCC)    Spinal cord injury of T9 vertebra (HCC)    Spinal cord injury, cervical region (HCC)    T5 spinal cord injury (HCC)    T6 spinal cord injury [facility])    Testicular torsion    Patient Active Problem List   Diagnosis Date Noted  Tinnitus aurium, bilateral 07/31/2023   HSP (hereditary spastic paraplegia) (HCC) 05/18/2023   Status post insertion of intrathecal baclofen pump 05/18/2023    Wheelchair dependence 09/06/2017   Psychotic disorder (HCC) 07/14/2015   Muscle spasticity 07/08/2014   Neurogenic bladder 07/08/2014   Conversion disorder with mixed symptoms 07/08/2014   Attention deficit hyperactivity disorder (ADHD) 01/25/2013   Unspecified convulsions (HCC) 08/26/2012   Past Surgical History:  Past Surgical History:  Procedure Laterality Date   CALCANEAL OSTEOTOMY W/ INTERNAL FIXATION     FOOT SURGERY     GASTROCNEMIUS RECESSION     hip surgery     KNEE SURGERY     NECK SURGERY     OSTEOTOMY TARSAL     POSTERIOR FUSION CERVICAL SPINE     TESTICLE SURGERY     TYMPANOSTOMY TUBE PLACEMENT     Developmental History: Milestones -- potentially delayed, able to drive with his hands, can transfer himself, can bear some weight on his legs, can only walk short distances with crutches but otherwise uses a wheelchair Therapies -- PT School -- graduated HS at 12, some college  Medications: Current Outpatient Medications on File Prior to Visit  Medication Sig Dispense Refill   baclofen (GABLOFEN) 10000 MCG/20ML SOLN 10,000 mcg by Intrathecal route once. Bolus 60 mg TID Prn     baclofen (LIORESAL) 20 MG tablet Take 20 mg by mouth 5 (five) times daily.      bisacodyl (DULCOLAX) 10 MG suppository Place 10 mg rectally as needed for mild constipation or moderate constipation.     cetirizine (ZYRTEC) 10 MG tablet Take 10 mg by mouth daily.     EPINEPHrine (EPIPEN 2-PAK) 0.3 mg/0.3 mL IJ SOAJ injection Inject 0.3 mLs (0.3 mg total) into the muscle as needed (anaphylaxis). (Patient not taking: Reported on 03/19/2018) 2 Device 2   levETIRAcetam (KEPPRA) 500 MG tablet Take 500 mg by mouth daily.     metoprolol tartrate (LOPRESSOR) 25 MG tablet Take 25 mg by mouth 2 (two) times daily.     nitroGLYCERIN (NITROSTAT) 0.3 MG SL tablet Place 1 tablet (0.3 mg total) under the tongue every 5 (five) minutes as needed (Autonomic dysreflexia). 60 tablet 5   omega-3 acid ethyl esters (LOVAZA)  1 g capsule Take 2 g by mouth 2 (two) times daily.     zinc gluconate 3.75 mg/mL SOLN Take 1 tablet by mouth daily.     No current facility-administered medications on file prior to visit.   Allergies:  Allergies  Allergen Reactions   Ondansetron Other (See Comments)    Swelling of tongue, lips, face 20 min after admin of Zofran treated with IV Benadryl, Solumedrol, and racemic epi nebulizer   Bupropion Rash   Duloxetine Hcl Other (See Comments)    Vivid dreams   Citalopram Other (See Comments)    Nose bleed    Linzess [Linaclotide] Other (See Comments)    Dizzy, couldn't function Dizzy, couldn't function   Nabumetone Other (See Comments)    extrimities fall asleep   Nitrofurantoin Monohyd Macro Other (See Comments)    Numbness in feet    Sitagliptin-Metformin Hcl Other (See Comments)    Interfere with baclofen pump   Tizanidine Other (See Comments)    Severe nose bleeds   Immunizations: Up to date  Review of Systems: Negative except as noted in the HPI  Family History: Patient was adopted Consanguinity: Unknown Please see the genetic counselor note for additional information  Social History: Mr. Earwood was placed in  foster care around age 4. He then went to a boarding home and was eventually adopted around age 75. Some of his siblings have learning disabilities, but no spasticity of which he is aware. There may have been substance use concerns with his parents, with possible prenatal exposures. He currently lives with adoptive parents in [place] and is on disability.  Vitals: Weight: 191 lb Height: 5'7" Head circumference: 57.7 cm (97%)  Genetics Physical Exam:  Constitution: The patient is active and alert  Head: No abnormalities detected in: head, hairline, shape or size    Anterior fontanelle flat: not flat    Anterior fontanelle open: not open    Bitemporal narrowing: forehead not narrow    Frontal bossing: no frontal bossing    Macrocephaly: not  macrocephalic    Microcephaly: not microcephalic    Plagiocephaly: not plagiocephalic  Face:    Midfacial hypoplasia: midfacial hypoplasia (comments: Malar flattening, right mouth/cheek appear to be lower than left, difficulty assessing lip/philtrum due to facial hair but they appear normal, normal teeth/palate)  Eyes: No abnormalities detected in: lids or pupils    Upslanting palpebral fissure: upslanting palpebral fissure (comments: synophrys)  Ears: No abnormalities detected in: ears    Low-set ears: ears not low set    Posteriorly rotated ears: ears not posteriorly rotated  Nose: No abnormalities detected in: nose, nasal bridge or nasal tip    Bulbous nasal tip: no prominent nasal tip    Columella below nares: no columella below nares    Depressed nasal bridge: no depressed nasal bridge    Flat nasal bridge: no flat nasal bridge    Hypoplastic alae nasi: nasal alae not underdeveloped     Upturned nasal tip: non-upturned nasal tip  Mouth: No abnormalities detected in: palate or teeth Teeth:    Abnormal shape: normal morphology     Discolored: normal color     Misaligned: no misalignment of teeth   Neck: (comments: Posterior neck scar)  Chest: No abnormalities detected in: chest, appearance, clavicles or scapulae    Inverted nipples: nipples not inverted    Pectus excavatum: no pectus excavatum  Cardiac: No abnormalities detected in: cardiovascular system    Abnormal distal perfusion: normal distal perfusion    Irregular rate: heart rate regular    Irregular rhythm: regular rhythm    Murmur: no murmur  Lungs: No abnormalities detected in: pulmonary system, bilateral auscultation or effort  Abdomen: No abnormalities detected in: abdomen or appearance    Abnormal umbilicus: normal umbilicus    Diastasis recti: no diastasis recti    Distended abdomen: no distension    Hepatosplenomegaly: no hepatosplenomegaly    Umbilical hernia: no umbilical  hernia  Neurological: (comments: Decreased DTRs and strength) Hair, Nails, and Skin: No abnormalities detected in: integumentary system, hair, nails or skin    Abnormally healed scars: no abnormally healed scars    Birthmarks: no birthmarks    Lesions: no lesions  Hands and Feet: No abnormalities detected in: distal extremities    Clinodactyly: no clinodactyly    Polydactyly: no polydactyly    Single palmar crease: multiple palmar creases    Syndactyly: no syndactyly   Photo of patient available (verbal consent obtained)   [place] Haldeman-Englert, MD [facility] Date: 07/31/2023 Time: 1130   Total time spent: 60 minutes Time spent includes face to face and non-face to face care for the patient on the date of this encounter (history and physical, genetic counseling, coordination of care, data gathering and/or documentation as  outlined).

## 2023-08-15 ENCOUNTER — Encounter: Payer: 59 | Attending: Physical Medicine and Rehabilitation | Admitting: Physical Medicine and Rehabilitation

## 2023-08-15 ENCOUNTER — Encounter: Payer: Self-pay | Admitting: Physical Medicine and Rehabilitation

## 2023-08-15 VITALS — BP 148/89 | HR 79 | Ht 67.0 in | Wt 198.0 lb

## 2023-08-15 DIAGNOSIS — G114 Hereditary spastic paraplegia: Secondary | ICD-10-CM | POA: Diagnosis not present

## 2023-08-15 DIAGNOSIS — R252 Cramp and spasm: Secondary | ICD-10-CM | POA: Insufficient documentation

## 2023-08-15 DIAGNOSIS — Z993 Dependence on wheelchair: Secondary | ICD-10-CM | POA: Diagnosis present

## 2023-08-15 MED ORDER — BACLOFEN 40000 MCG/20ML IT SOLN
80000.0000 ug | Freq: Once | INTRATHECAL | Status: AC
  Administered 2023-08-15: 80000 ug via INTRATHECAL

## 2023-08-15 NOTE — Patient Instructions (Signed)
  New refill date BY 1/11/2/5- new refill date is actually 09/24/23 by Dr Shearon Stalls

## 2023-08-15 NOTE — Progress Notes (Signed)
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  Here for evaluation of SCI   PTM- can lock out for 10 minute increments- and sometimes locked out 1.5 hours.    Plan: Can make PTM a rolling clock-  So doesn't get locked out as often 60 mcg each time 2 minute duration 10 minutes lockout 6x/day- with 6 boluses per 24 hours  Baclofen Pump Refill. (Synchromed II)    After informed consent and interrogation of the patient's baclofen pump to confirm residual volume, rate, concentration, etc, his abdomen was prepped with betadine. Baclofen kit was then opened at beside. Sterile technique was utilized to dawn gloves and drape the area. Using the plastic guide, I localized the fill port and inserted a needle with clamped line into the pump. Next, the remaining baclofen was removed and the volume was compatible with interrogation device (13.40ml). Thereafter, baclofen was drawn into the syringe from the supplied vial. A filter was applied to syringe and excess air was expelled through the filter.. I then attached the syringe/filter to the clamped line .  Then the line was unclamped and with slow, gradual pressure I replaced  40cc of intrathecal baclofen (2 ampule of 40 mg/20 mL (2000 mcg/mL)) into the pump. The line was re-clamped and I removed the needle from the abdomen.  The area was then cleaned and dressing was applied. .  After completing the fill, the pump was reinterrogated to confirm concentration/volume/rate. Pump refill is due by 10/06/2023- next appt scheduled 09/24/23 with Dr Shearon Stalls. Also made other appointments.    Also transitioned from 260cc mcg/ml custom concentration to 2000 mcg/ml concentration- had assistance from [employer] rep Mardella Layman to help- so cannot use PTM for 28 hours because of bridge bolus required- that was  programed.    Due to complications- of not being able to get into pump, in spite of correct positioning and detailing the pump point, etc, required U/S done by Dr Shearon Stalls to find hole to refill pump-  Required 1 hour to fill pump- pt tolerated well. Sterile technique maintained.

## 2023-08-16 ENCOUNTER — Encounter: Payer: Self-pay | Admitting: Physical Medicine and Rehabilitation

## 2023-08-17 NOTE — Telephone Encounter (Signed)
Called and let pt know we changed the dose SLIGHTLY because the concentration had changed, so had no option.  Also the dose restriction was changed ot help him has less lock out- per [employer] .

## 2023-08-20 ENCOUNTER — Telehealth: Payer: Self-pay | Admitting: Genetic Counselor

## 2023-08-20 NOTE — Progress Notes (Signed)
Spoke with [name] regarding his recent genetic testing.  To review, [name] was seen by [facility] due to a complex medical history involving spasticity of the lower extremities, neurogenic bowel and bladder, learning disability, ADHD, autism spectrum disorder, and non-epileptic  seizures.  Genetic testing was ordered this appointment to determine if there is an underlying genetic cause for his symptoms.   The [facility] Whole Exome Sequencing (WES) was negative/normal. At this time, we have not identified a genetic cause for [name] symptoms.  No changes to healthcare management or testing of other family members are recommended at this time.  [name] management should be based on his clinical symptoms.  We discussed that Whole Exome Sequencing Reanalysis can be initiated 2-3 years following the initial test and [name] was encouraged to reach out if this is of interest to him in the future. [name] was encouraged to reach out with any further questions.  This test report has been released to him and is attached to the associated order.  Tilda Franco, MS [facility]  Certified Genetic Counselor

## 2023-09-22 ENCOUNTER — Encounter (HOSPITAL_COMMUNITY): Payer: Self-pay

## 2023-09-22 ENCOUNTER — Emergency Department (HOSPITAL_COMMUNITY)
Admission: EM | Admit: 2023-09-22 | Discharge: 2023-09-22 | Disposition: A | Discharge status: Home / Self Care | Payer: 59 | Attending: Emergency Medicine | Admitting: Emergency Medicine

## 2023-09-22 ENCOUNTER — Other Ambulatory Visit: Payer: Self-pay

## 2023-09-22 DIAGNOSIS — N4830 Priapism, unspecified: Secondary | ICD-10-CM | POA: Diagnosis present

## 2023-09-22 MED ORDER — LIDOCAINE HCL 2 % IJ SOLN
10.0000 mL | Freq: Once | INTRAMUSCULAR | Status: AC
  Administered 2023-09-22: 200 mg
  Filled 2023-09-22: qty 20

## 2023-09-22 MED ORDER — PHENYLEPHRINE 200 MCG/ML FOR PRIAPISM / HYPOTENSION
200.0000 ug | Freq: Once | INTRAMUSCULAR | Status: AC
  Administered 2023-09-22: 200 ug via INTRACAVERNOUS
  Filled 2023-09-22: qty 50

## 2023-09-22 MED ORDER — METOPROLOL TARTRATE 25 MG PO TABS
25.0000 mg | ORAL_TABLET | Freq: Once | ORAL | Status: AC
  Administered 2023-09-22: 25 mg via ORAL
  Filled 2023-09-22: qty 1

## 2023-09-22 MED ORDER — LIDOCAINE HCL (PF) 1 % IJ SOLN
15.0000 mL | Freq: Once | INTRAMUSCULAR | Status: AC
  Administered 2023-09-22: 15 mL
  Filled 2023-09-22: qty 15

## 2023-09-22 MED ORDER — DIAZEPAM 5 MG PO TABS
5.0000 mg | ORAL_TABLET | Freq: Once | ORAL | Status: AC
  Administered 2023-09-22: 5 mg via ORAL
  Filled 2023-09-22: qty 1

## 2023-09-22 NOTE — ED Provider Notes (Signed)
[place] EMERGENCY DEPARTMENT AT [facility] Provider Note  HPI   Juan Blake is a 32 y.o. 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  Back in 2017, patient had a penile implant, removed a week later as it had gotten infected, he has had no infections of his penis since, and has no recurrent penile implant.  Patient is had recurrent priapism's, and follows actively with urology for this issue, he is here today because this morning, he started having a mild priapism, that will not go down despite pseudoephedrine, and ice.  He is here today for evaluation for this  ROS Negative except as per HPI   Medical Decision Making   Upon presentation, the patient is aferbile HDS, slightly tachycardic.   Patient has a medium hard penis, that is semierect, there is no bruising is no blood at the rectal meatus, testicles normal \  Please see full documentation below, but essentially, started with phenylephrine, repeat phenylephrine, unfortunately both doses did not work, performed another dose of phenylephrine, and then also aspiration of about 15 cc of fluid.  Had been in the interim icing and waiting between these times.  Ultimately, we were able to achieve detumescence, and the penis appears much more soft, there is a very mild hematoma on the right side of the penis.    Please see full procedure note below, but essentially multiple rounds of phenylephrine, ultimately aspiration, ice in between and manual compression, and we will be discharging this patient we will Curlex wrap the patient his penis, he will follow-up with urology, at the time of discharge, the patient is penis is soft.  There is a very mild hematoma on the right side of his penis, and he states that he believes his penis is much more closer to normal size at this point.    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  I discussed with the patient and/or family my overall assessment, including my physical exam, labs, imaging, other diagnostic tests, and therapeutics given.  All questions answered and understanding is expressed.  I have instructed to call PCP to establish an outpatient appointment after this ED visit, and necessary specialty follow up if needed. I gave strict return precautions to come back to the ED including fevers, chills, severe pain, worsening of symptoms, return of symptoms, new and concerning symptoms, inability to tolerate p.o. intake, among others. I specifically stated to return if symptoms worsen return   1. Priapism     @DISPOSITION @  Rx / DC Orders ED Discharge Orders     None        Past Medical History:  Diagnosis Date   Autonomic dysreflexia    Concussion    Neurogenic bladder disorder    Neurogenic bowel    PTSD (post-traumatic stress disorder)    Spastic paraplegia    Spinal cord injury of T10 vertebra (HCC)    Spinal cord injury of T9 vertebra (HCC)    Spinal cord injury, cervical region (HCC)    T5 spinal  cord injury (HCC)    T6 spinal cord injury [facility])    Testicular torsion    Past Surgical History:  Procedure Laterality Date   CALCANEAL OSTEOTOMY W/ INTERNAL FIXATION     FOOT SURGERY     GASTROCNEMIUS RECESSION     hip surgery     KNEE SURGERY     NECK SURGERY     OSTEOTOMY TARSAL     POSTERIOR FUSION CERVICAL SPINE     TESTICLE SURGERY     TYMPANOSTOMY TUBE PLACEMENT     History reviewed. No pertinent family history. Social History   Socioeconomic History   Marital status: Single    Spouse name: Not  on file   Number of children: Not on file   Years of education: Not on file   Highest education level: Not on file  Occupational History   Not on file  Tobacco Use   Smoking status: Never   Smokeless tobacco: Never  Vaping Use   Vaping status: Not on file  Substance and Sexual Activity   Alcohol use: No   Drug use: No   Sexual activity: Never  Other Topics Concern   Not on file  Social History Narrative   Not on file   Social Drivers of Health   Financial Resource Strain: Low Risk  (06/26/2022)   Received [facility]   Overall Financial Resource Strain (CARDIA)    Difficulty of Paying Living Expenses: Not hard at all  Food Insecurity: Low Risk  (08/22/2023)   Received from [facility]   Hunger Vital Sign    Worried About Running Out of Food in the Last Year: Never true    Ran Out of Food in the Last Year: Never true  Transportation Needs: No Transportation Needs (08/22/2023)   Received from [facility]    In the past 12 months, has lack of reliable transportation kept you from medical appointments, meetings, work or from getting things needed for daily living? : No  Physical Activity: Inactive (06/26/2022)   Received from [facility]   Exercise Vital Sign    Days of Exercise per Week: 0 days    Minutes of Exercise per Session: 90 min  Stress: No Stress Concern Present (06/26/2022)   Received from [facility] - Occupational Stress Questionnaire    Feeling of Stress : Not at all  Social Connections: Unknown (07/08/2023)   Received from [facility]   Social Network    Social Network: Not on file  Intimate Partner Violence: Unknown (07/08/2023)   Received from [facility]   HITS    Physically Hurt: Not on file    Insult or Talk Down To: Not on file    Threaten Physical Harm: Not on file    Scream or Curse: Not on file     Physical Exam   Vitals:   09/22/23 2100 09/22/23 2115 09/22/23  2300 09/22/23 2305  BP: 120/83 121/80 110/80   Pulse: 88 90 87   Resp: 14 18 17    Temp:    98.4 F (36.9 C)  TempSrc:    Oral  SpO2: 94% 96% 95%   Weight:      Height:        Physical Exam Vitals and nursing note reviewed.  Constitutional:      General: He is not in acute distress.    Appearance: Normal appearance. He is well-developed. He is not ill-appearing or toxic-appearing.  HENT:     Head: Normocephalic and atraumatic.     Right Ear: External ear normal.     Left Ear: External ear normal.     Nose: Nose normal.     Mouth/Throat:     Mouth: Mucous membranes are moist.  Eyes:     Extraocular Movements: Extraocular movements intact.     Pupils: Pupils are equal, round, and reactive to light.  Cardiovascular:     Rate and Rhythm: Normal rate.     Pulses: Normal pulses.  Pulmonary:     Effort: Pulmonary effort is normal. No respiratory distress.     Breath sounds: Normal breath sounds. No stridor. No wheezing, rhonchi or rales.  Abdominal:     Palpations: Abdomen is soft.     Tenderness: There is no abdominal tenderness. There is no right CVA tenderness or left CVA tenderness.     Comments: Suprapubic catheter in place  Genitourinary:    Comments: Patient has a medium hard penis, that is semierect, there is no bruising is no blood at the rectal meatus, testicles normal Musculoskeletal:        General: Normal range of motion.     Cervical back: Normal range of motion and neck supple.  Skin:    General: Skin is warm and dry.     Capillary Refill: Capillary refill takes less than 2 seconds.  Neurological:     Mental Status: He is alert and oriented to person, place, and time. Mental status is at baseline.  Psychiatric:        Mood and Affect: Mood normal.      Procedures   If procedures were preformed on this patient, they are listed below:  Irrigate corpus cavern, priapism  Date/Time: 09/22/2023 11:20 PM  Performed by: Gunnar Bulla, MD Authorized by: Charlynne Pander, MD  Consent: Verbal consent obtained. Risks and benefits: risks, benefits and alternatives were discussed Consent given by: patient Patient identity confirmed: verbally with patient and hospital-assigned identification number Time out: Immediately prior to procedure a "time out" was called to verify the correct patient, procedure, equipment, support staff and site/side marked as required. Preparation: Patient was prepped and draped in the usual sterile fashion. Local anesthesia used: yes Anesthesia: local infiltration  Anesthesia: Local anesthesia used: yes Local Anesthetic: lidocaine 1% without epinephrine Anesthetic total: 3 mL  Sedation: Patient sedated: no  Patient tolerance: patient tolerated the procedure well with no immediate complications Comments: As patient does not have any sensation to the penis in general, a penile block was not performed.  However we did do a very small amount of local sedation at the site which was on the right lateral aspect of the penis in the midshaft.  We initially injected 200 mcg of phenylephrine, waited 30 minutes, with ice on it, still having some mild erection, another of phenylephrine.  Waited 30 minutes with ice, we also applied manual pressure during this time.  Did another 100 mcg of phenylephrine, and at this time performed aspiration as well, got about 10 cc out, I did use an 18-gauge needle at that point, for all other injections we were using a 25-gauge needle.  Put ice pack on it, went to revive the patient and detumescence had occurred     The patient was seen, evaluated, and treated in conjunction with the attending physician, who voiced agreement in the care provided.  Note generated using Dragon voice dictation software and may contain dictation errors. Please contact me for  any clarification or with any questions.   Electronically signed by:  Osvaldo Shipper, M.D. (PGY-2)    Gunnar Bulla, MD 09/22/23  2327    Charlynne Pander, MD 09/24/23 [phone]

## 2023-09-22 NOTE — ED Notes (Signed)
Urine sent to main lab.

## 2023-09-22 NOTE — ED Triage Notes (Signed)
Pt reports penile erection that wont go down since 0800 this morning. Hx of spinal cord injury and autonomic dysreflexia.

## 2023-09-22 NOTE — ED Notes (Signed)
Pt provided ice pack 

## 2023-09-22 NOTE — Discharge Instructions (Addendum)
You have been seen here in the emergency department for priapism. We have obtained a full history, performed a physical exam, in addition to other diagnostic tests and treatments. Right now, we feel that you are safe for discharge from a medical perspective, and do not have an acute life threatening illness.   To do: 1.) Take all medications as prescribed.   2.) If anything changes, or you develop fevers, chills, inability to eat or drink, severe pain, new symptoms, return of symptoms, worsening of symptoms, or any other concerns, please call 911 or come back to the emergency department as soon as possible.   3.) Please make an appointment with your primary care doctor for a follow-up visit after being seen here in the emergency department.   Follow up with your urologist. Pseudoephedrine may help.  Thank you for allowing me to take care of you today. We hope that you feel better soon.

## 2023-09-23 ENCOUNTER — Encounter (HOSPITAL_COMMUNITY): Payer: Self-pay

## 2023-09-23 ENCOUNTER — Emergency Department (HOSPITAL_COMMUNITY)
Admission: EM | Admit: 2023-09-23 | Discharge: 2023-09-23 | Discharge status: Against Medical Advice | Payer: 59 | Source: Home / Self Care

## 2023-09-23 ENCOUNTER — Other Ambulatory Visit: Payer: Self-pay

## 2023-09-23 ENCOUNTER — Emergency Department (HOSPITAL_COMMUNITY)
Admission: EM | Admit: 2023-09-23 | Discharge: 2023-09-23 | Disposition: A | Discharge status: Home / Self Care | Payer: 59 | Attending: Emergency Medicine | Admitting: Emergency Medicine

## 2023-09-23 DIAGNOSIS — N4830 Priapism, unspecified: Secondary | ICD-10-CM | POA: Diagnosis not present

## 2023-09-23 DIAGNOSIS — Z5321 Procedure and treatment not carried out due to patient leaving prior to being seen by health care provider: Secondary | ICD-10-CM | POA: Insufficient documentation

## 2023-09-23 DIAGNOSIS — Z91040 Latex allergy status: Secondary | ICD-10-CM | POA: Insufficient documentation

## 2023-09-23 DIAGNOSIS — N529 Male erectile dysfunction, unspecified: Secondary | ICD-10-CM | POA: Diagnosis present

## 2023-09-23 DIAGNOSIS — N50819 Testicular pain, unspecified: Secondary | ICD-10-CM | POA: Insufficient documentation

## 2023-09-23 DIAGNOSIS — Z79899 Other long term (current) drug therapy: Secondary | ICD-10-CM | POA: Diagnosis not present

## 2023-09-23 MED ORDER — DIAZEPAM 5 MG PO TABS
10.0000 mg | ORAL_TABLET | Freq: Once | ORAL | Status: AC
  Administered 2023-09-23: 10 mg via ORAL
  Filled 2023-09-23: qty 2

## 2023-09-23 MED ORDER — LIDOCAINE HCL (PF) 1 % IJ SOLN
30.0000 mL | Freq: Once | INTRAMUSCULAR | Status: AC
  Administered 2023-09-23: 30 mL
  Filled 2023-09-23: qty 30

## 2023-09-23 MED ORDER — PHENYLEPHRINE 200 MCG/ML FOR PRIAPISM / HYPOTENSION
200.0000 ug | INTRAMUSCULAR | Status: DC | PRN
  Administered 2023-09-23: 200 ug via INTRACAVERNOUS
  Filled 2023-09-23 (×2): qty 50

## 2023-09-23 MED ORDER — DIAZEPAM 5 MG PO TABS: 5.0000 mg | ORAL_TABLET | Freq: Two times a day (BID) | ORAL | 0 refills | Status: AC

## 2023-09-23 NOTE — ED Provider Notes (Addendum)
[place] EMERGENCY DEPARTMENT AT [facility] Provider Note   CSN: [id number] Arrival date & time: 09/23/23  1256     History  Chief Complaint  Patient presents with   Male GU Problem    Juan Blake is a 32 y.o. male.  32 yo M with a chief complaints of having an erection that will not improved.  He tells me that this has been going on since about 3 this morning.  He actually had the same problem last night and was required to injections and aspiration.  He denies any new medications.  He has a history of a spinal cord injury.   Male GU Problem      Home Medications Prior to Admission medications   Medication Sig Start Date End Date Taking? Authorizing Provider  diazepam (VALIUM) 5 MG tablet Take 1 tablet (5 mg total) by mouth 2 (two) times daily. 09/23/23  Yes Melene Plan, DO  baclofen (GABLOFEN) 10000 MCG/20ML SOLN 10,000 mcg by Intrathecal route once. Bolus 60 mg TID Prn    [provider]  baclofen (LIORESAL) 20 MG tablet Take 20 mg by mouth 5 (five) times daily.     [provider]  bisacodyl (DULCOLAX) 10 MG suppository Place 10 mg rectally as needed for mild constipation or moderate constipation.    [provider]  cetirizine (ZYRTEC) 10 MG tablet Take 10 mg by mouth daily.    [provider]  levETIRAcetam (KEPPRA) 500 MG tablet Take 500 mg by mouth daily.    [provider]  metoprolol tartrate (LOPRESSOR) 25 MG tablet Take 25 mg by mouth 2 (two) times daily.    [provider]  nitroGLYCERIN (NITROSTAT) 0.3 MG SL tablet Place 1 tablet (0.3 mg total) under the tongue every 5 (five) minutes as needed (Autonomic dysreflexia). 05/18/23   Lovorn, Aundra Millet, MD  omega-3 acid ethyl esters (LOVAZA) 1 g capsule Take 2 g by mouth 2 (two) times daily.    [provider]  zinc gluconate 3.75 mg/mL SOLN Take 1 tablet by mouth daily.    [provider]      Allergies    Bupropion, Ondansetron, Sulfa  antibiotics, Sulfamethoxazole-trimethoprim, Duloxetine hcl, Latex, Citalopram, Duloxetine, Levetiracetam, Linzess [linaclotide], Metformin, Nabumetone, Nitrofurantoin monohyd macro, Rosuvastatin, Sitagliptin-metformin hcl, Tizanidine, Doxycycline, and Topiramate    Review of Systems   Review of Systems  Physical Exam Updated Vital Signs BP (!) 166/99 (BP Location: Right Arm)   Pulse (!) 114   Temp 98.3 F (36.8 C)   Resp 16   Ht 5\' 7"  (1.702 m)   Wt 88 kg   SpO2 97%   BMI 30.38 kg/m  Physical Exam Vitals and nursing note reviewed.  Constitutional:      Appearance: He is well-developed.  HENT:     Head: Normocephalic and atraumatic.  Eyes:     Pupils: Pupils are equal, round, and reactive to light.  Neck:     Vascular: No JVD.  Cardiovascular:     Rate and Rhythm: Normal rate and regular rhythm.     Heart sounds: No murmur heard.    No friction rub. No gallop.  Pulmonary:     Effort: No respiratory distress.     Breath sounds: No wheezing.  Abdominal:     General: There is no distension.     Tenderness: There is no abdominal tenderness. There is no guarding or rebound.  Genitourinary:    Comments: Circumcised.  Erect.  Musculoskeletal:  General: Normal range of motion.     Cervical back: Normal range of motion and neck supple.  Skin:    Coloration: Skin is not pale.     Findings: No rash.  Neurological:     Mental Status: He is alert and oriented to person, place, and time.  Psychiatric:        Behavior: Behavior normal.     ED Results / Procedures / Treatments   Labs (all labs ordered are listed, but only abnormal results are displayed) Labs Reviewed - No data to display  EKG None  Radiology No results found.  Procedures Irrigate corpus cavern, priapism  Date/Time: 09/23/2023 3:20 PM  Performed by: Melene Plan, DO Authorized by: Melene Plan, DO  Consent: Verbal consent obtained. Risks and benefits: risks, benefits and alternatives were  discussed Consent given by: patient Patient identity confirmed: verbally with patient Preparation: Patient was prepped and draped in the usual sterile fashion. Local anesthesia used: yes Anesthesia: nerve block  Anesthesia: Local anesthesia used: yes Local Anesthetic: lidocaine 1% without epinephrine Anesthetic total: 4 mL  Sedation: Patient sedated: no  Patient tolerance: patient tolerated the procedure well with no immediate complications       Medications Ordered in ED Medications  phenylephrine 200 mcg / ml CONC. DILUTION INJ (ED / Urology USE ONLY) (200 mcg Intracavernosal Given 09/23/23 1439)  lidocaine (PF) (XYLOCAINE) 1 % injection 30 mL (30 mLs Other Given by Other 09/23/23 1437)  diazepam (VALIUM) tablet 10 mg (10 mg Oral Given 09/23/23 1436)    ED Course/ Medical Decision Making/ A&P                                 Medical Decision Making Risk Prescription drug management.   32 yo M with a chief complaints of priapism.  Patient was actually seen yesterday for the same.  Had aspiration and injection with eventually resolution.  Unfortunately had recurrence just some hours afterwards.  He is slightly flaccid on my exam.  He is having some tachycardia with this and hypertension.  He denies any other specific new issues.  No new medications.  No penile stimulation.  I discussed case with urology, Dr. Pete Glatter he thought reasonable to retry aspiration and phenylephrine treatment.  Priapism therapy here successful.  Will discharge home.  Urology follow-up.   3:43 PM Patient asked to talk with me.  He is worried that maybe it was getting worse again.  Objectively I do not appreciate any obvious worsening.  I discussed different options including calling our urologist back here.  He would prefer to go to the [facility] at this time and will be seen there.   3:21 PM:  I have discussed the diagnosis/risks/treatment options with the patient.  Evaluation and  diagnostic testing in the emergency department does not suggest an emergent condition requiring admission or immediate intervention beyond what has been performed at this time.  They will follow up with Urology. We also discussed returning to the ED immediately if new or worsening sx occur. We discussed the sx which are most concerning (e.g., sudden worsening pain, fever, inability to tolerate by mouth, recurrent priapism) that necessitate immediate return. Medications administered to the patient during their visit and any new prescriptions provided to the patient are listed below.  Medications given during this visit Medications  phenylephrine 200 mcg / ml CONC. DILUTION INJ (ED / Urology USE ONLY) (200 mcg Intracavernosal  Given 09/23/23 1439)  lidocaine (PF) (XYLOCAINE) 1 % injection 30 mL (30 mLs Other Given by Other 09/23/23 1437)  diazepam (VALIUM) tablet 10 mg (10 mg Oral Given 09/23/23 1436)     The patient appears reasonably screen and/or stabilized for discharge and I doubt any other medical condition or other [facility] requiring further screening, evaluation, or treatment in the ED at this time prior to discharge.          Final Clinical Impression(s) / ED Diagnoses Final diagnoses:  Priapism    Rx / DC Orders ED Discharge Orders          Ordered    diazepam (VALIUM) 5 MG tablet  2 times daily        09/23/23 1519              Melene Plan, DO 09/23/23 1521    Melene Plan, DO 09/23/23 1544

## 2023-09-23 NOTE — ED Triage Notes (Signed)
Patient has a spinal cord injury and has had priaprism since 0300.  Seen here yesterday for same.

## 2023-09-23 NOTE — Discharge Instructions (Addendum)
Follow up with your urologist in the office.  Return for repeat issues or go to [facility] or go to an [facility].

## 2023-09-23 NOTE — ED Triage Notes (Addendum)
Previous noted at this timestamp charted in error.

## 2023-09-23 NOTE — ED Provider Triage Note (Cosign Needed)
Emergency Medicine Provider Triage Evaluation Note  Juan Blake , a 32 y.o. male  was evaluated in triage.  Pt complains of having priapism since 3AM today. Had it drained yesterday and recurrently. Has tried icing it without relief.   Review of Systems  Positive: priapism Negative: fevers  Physical Exam  BP (!) 166/99 (BP Location: Right Arm)   Pulse (!) 114   Temp 98.3 F (36.8 C)   Resp 16   SpO2 97%  Gen:   Awake, no distress   Resp:  Normal effort  MSK:   Moves extremities without difficulty  Other:  +priapism  Medical Decision Making  Medically screening exam initiated at 1:32 PM.  Appropriate orders placed.  Juan Blake was informed that the remainder of the evaluation will be completed by another provider, this initial triage assessment does not replace that evaluation, and the importance of remaining in the ED until their evaluation is complete.    Pete Pelt, [place] 09/23/23 1333

## 2023-09-24 ENCOUNTER — Encounter: Payer: 59 | Admitting: Physical Medicine and Rehabilitation

## 2023-10-08 ENCOUNTER — Encounter: Payer: 59 | Attending: Physical Medicine and Rehabilitation | Admitting: Physical Medicine and Rehabilitation

## 2023-10-08 VITALS — BP 130/85 | HR 82 | Ht 67.0 in

## 2023-10-08 DIAGNOSIS — Z978 Presence of other specified devices: Secondary | ICD-10-CM

## 2023-10-08 DIAGNOSIS — G114 Hereditary spastic paraplegia: Secondary | ICD-10-CM | POA: Diagnosis present

## 2023-10-08 DIAGNOSIS — R252 Cramp and spasm: Secondary | ICD-10-CM

## 2023-10-08 MED ORDER — BACLOFEN 40000 MCG/20ML IT SOLN
80000.0000 ug | Freq: Once | INTRATHECAL | Status: AC
  Administered 2023-10-08: 80000 ug via INTRATHECAL

## 2023-10-08 NOTE — Patient Instructions (Signed)
 Call clinic if any issues with bolus frequency as discussed; NOT to exceed 6 boluses per day if system allows.  Follow up with Dr. Berline Chough as scheduled

## 2023-10-08 NOTE — Progress Notes (Signed)
 Baclofen  Pump Refill and adjustment. 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   He has another MRI 1/13 and he has already told [facility] to get [employer] to come to his home after the procedure.   Baclofen  pump titration    After informed consent and interrogation of the patient's baclofen  pump to confirm residual volume, rate, concentration, etc, his abdomen was prepped with betadine. Baclofen  kit was then opened at beside. Sterile technique was utilized to dawn gloves and drape the area. Using the plastic guide, I localized the fill port and inserted a needle with clamped line into the pump. Next, the remaining baclofen  was removed and the volume was compatible with interrogation device (estimated 7.1 ml, actual 9.0 ml ).  Thereafter, baclofen  was drawn into the syringe from the supplied vial. A filter was applied to syringe and excess air was expelled through the filter. I then attached the syringe/filter to the clamped line . Then the line was unclamped and with slow, gradual pressure I replaced 40cc of intrathecal baclofen  (2 ampule of 40 mg/20 mL (2000 mcg/ml)) into the pump. The line was re-clamped and I removed the needle from the abdomen. The area was then cleaned and dressing was applied.   Pump position notes: facing 3 pm; button approximately 3/4 inch medial to scar   After completing the fill, the pump was reinterrogated and updated to confirm the concentration/volume/rate of 1,115.6mcg/day continuous with 60.1 mcg boluses 6x per day for total 1475.7 mcg/day.  His new alarm date is now 11/27/23. Already scheduled with Dr. Lovorn for pump titration and refill 11/14/23.    Of note, pump boluses were set to 60.1 mcg with 6 boluses/day total with 10 minute lockout and 1 bolus/4 hour.   After discussion with Dr. Lovorn, adjusted to 60.1 mcg with 6 boluses/day total with 10 minute lockout and  6 bolus/1 hour - this way he may get boluses every 10 minutes up to 6 per day, which was the intended frequency.     Juan JAYSON Likes, DO 10/08/2023

## 2023-10-11 ENCOUNTER — Telehealth: Payer: Self-pay | Admitting: Physical Medicine and Rehabilitation

## 2023-10-11 NOTE — Telephone Encounter (Signed)
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 Patient would like to know what medication would be most helpful for him states he has his nitroglycerin at all times .

## 2023-10-12 ENCOUNTER — Emergency Department (HOSPITAL_COMMUNITY)
Admission: EM | Admit: 2023-10-12 | Discharge: 2023-10-12 | Discharge status: Against Medical Advice | Payer: 59 | Attending: Emergency Medicine | Admitting: Emergency Medicine

## 2023-10-12 ENCOUNTER — Encounter (HOSPITAL_COMMUNITY): Payer: Self-pay | Admitting: Emergency Medicine

## 2023-10-12 ENCOUNTER — Other Ambulatory Visit: Payer: Self-pay

## 2023-10-12 DIAGNOSIS — N4830 Priapism, unspecified: Secondary | ICD-10-CM | POA: Diagnosis present

## 2023-10-12 DIAGNOSIS — Z5321 Procedure and treatment not carried out due to patient leaving prior to being seen by health care provider: Secondary | ICD-10-CM | POA: Diagnosis not present

## 2023-10-12 MED ORDER — HYDROCODONE-ACETAMINOPHEN 5-325 MG PO TABS: 1.0000 | ORAL_TABLET | Freq: Three times a day (TID) | ORAL | 0 refills | Status: AC | PRN

## 2023-10-12 NOTE — Telephone Encounter (Signed)
Constant priapism. Prolonged erections Has penile implant to help control them.. Had implant removed due to sepsis-  Plans is to get another penile implant.   Waiting on MRI for hip- to see if fractured  Cannot handle, because AD is "killing him".  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  Hot clammy, sweats, HA- and BP up to 190s/100's.    Got Valium for priapism-  Occ has to "drain it"-   Has been taking NTG max 2x/day at most.  Thinks it's the hip pain and priapism that's causing it.  Semi hard Erection now- thinks will need to go to ED to get drained tonight- if doesn't go away.   Hip MRI next Friday-   Will try Norco 5/325 mg up to 3x/day as needed for hip pain to try and treat/prevent AD Sx's- 1 week supply since first Rx- call me and let me know how it works- esp since MRI results won't be back next Friday.

## 2023-10-12 NOTE — Addendum Note (Signed)
Addended by: Genice Rouge on: 10/12/2023 01:14 PM   Modules accepted: Orders

## 2023-10-12 NOTE — ED Triage Notes (Signed)
Pt states that he has a priapism. Pt states she does feel pain due to spinal cord injury however heart rate and bp go up when in pain. Pt states he took a diazepam with no relief. Pt also tried to squeeze to get it to drain with no success. Pt states this has happened in the past and required drainage.

## 2023-10-17 ENCOUNTER — Encounter (HOSPITAL_COMMUNITY): Payer: Self-pay | Admitting: *Deleted

## 2023-10-17 ENCOUNTER — Other Ambulatory Visit: Payer: Self-pay

## 2023-10-17 ENCOUNTER — Emergency Department (HOSPITAL_COMMUNITY)
Admission: EM | Admit: 2023-10-17 | Discharge: 2023-10-17 | Disposition: A | Discharge status: Home / Self Care | Payer: 59 | Attending: Emergency Medicine | Admitting: Emergency Medicine

## 2023-10-17 DIAGNOSIS — Z79899 Other long term (current) drug therapy: Secondary | ICD-10-CM | POA: Insufficient documentation

## 2023-10-17 DIAGNOSIS — I10 Essential (primary) hypertension: Secondary | ICD-10-CM | POA: Insufficient documentation

## 2023-10-17 DIAGNOSIS — N4830 Priapism, unspecified: Secondary | ICD-10-CM | POA: Insufficient documentation

## 2023-10-17 DIAGNOSIS — R000 Tachycardia, unspecified: Secondary | ICD-10-CM | POA: Insufficient documentation

## 2023-10-17 DIAGNOSIS — Z91040 Latex allergy status: Secondary | ICD-10-CM | POA: Diagnosis not present

## 2023-10-17 MED ORDER — LIDOCAINE-EPINEPHRINE 1 %-1:100000 IJ SOLN
10.0000 mL | Freq: Once | INTRAMUSCULAR | Status: AC
  Administered 2023-10-17: 10 mL via INTRADERMAL
  Filled 2023-10-17: qty 1

## 2023-10-17 MED ORDER — ACETAMINOPHEN 500 MG PO TABS
1000.0000 mg | ORAL_TABLET | Freq: Once | ORAL | Status: AC
  Administered 2023-10-17: 1000 mg via ORAL
  Filled 2023-10-17: qty 2

## 2023-10-17 MED ORDER — PHENYLEPHRINE 200 MCG/ML FOR PRIAPISM / HYPOTENSION
50.0000 ug | Freq: Once | INTRAMUSCULAR | Status: AC
  Administered 2023-10-17: 50 ug via INTRACAVERNOUS
  Filled 2023-10-17: qty 50

## 2023-10-17 MED ORDER — METOPROLOL TARTRATE 25 MG PO TABS
25.0000 mg | ORAL_TABLET | Freq: Once | ORAL | Status: AC
  Administered 2023-10-17: 25 mg via ORAL
  Filled 2023-10-17: qty 1

## 2023-10-17 NOTE — ED Provider Notes (Signed)
[facility] Provider Note   CSN: [id number] Arrival date & time: 10/17/23  1629     History  Chief Complaint  Patient presents with   pripism    Juan Blake is a 33 y.o. male with PMH as listed below who presents with priapism since 9 am this morning. He is paraplegic w/ suprapubic catheter and states he has had priapism >10 times, including as recently as 10/12/23. He tried valium and ice packs at home which somewhat improved it but he still has it there. He does not have sensation in his lower body but states that he knows he is uncomfortable because of his autonomic reaction, including tachycardia and hypertension. He state she was scheduled to have penile implant placed tomorrow but it was put on hold due to possible hip fracture pending MRI. Messaged his urologist online today and instructions per urology were to go get it drained in the ED.   Past Medical History:  Diagnosis Date   Autonomic dysreflexia    Concussion    Neurogenic bladder disorder    Neurogenic bowel    PTSD (post-traumatic stress disorder)    Spastic paraplegia    Spinal cord injury of T10 vertebra (HCC)    Spinal cord injury of T9 vertebra (HCC)    Spinal cord injury, cervical region (HCC)    T5 spinal cord injury (HCC)    T6 spinal cord injury [facility])    Testicular torsion        Home Medications Prior to Admission medications   Medication Sig Start Date End Date Taking? Authorizing Provider  baclofen (GABLOFEN) 10000 MCG/20ML SOLN 10,000 mcg by Intrathecal route once. Bolus 60 mg TID Prn    [provider]  baclofen (LIORESAL) 20 MG tablet Take 20 mg by mouth 5 (five) times daily.     [provider]  bisacodyl (DULCOLAX) 10 MG suppository Place 10 mg rectally as needed for mild constipation or moderate constipation.    [provider]  cetirizine (ZYRTEC) 10 MG tablet Take 10 mg by mouth daily.    [provider]   diazepam (VALIUM) 5 MG tablet Take 1 tablet (5 mg total) by mouth 2 (two) times daily. 09/23/23   Melene Plan, DO  HYDROcodone-acetaminophen (NORCO) 5-325 MG tablet Take 1 tablet by mouth 3 (three) times daily as needed for moderate pain (pain score 4-6). 10/12/23   Lovorn, Aundra Millet, MD  levETIRAcetam (KEPPRA) 500 MG tablet Take 500 mg by mouth daily.    [provider]  metoprolol tartrate (LOPRESSOR) 25 MG tablet Take 25 mg by mouth 2 (two) times daily.    [provider]  nitroGLYCERIN (NITROSTAT) 0.3 MG SL tablet Place 1 tablet (0.3 mg total) under the tongue every 5 (five) minutes as needed (Autonomic dysreflexia). 05/18/23   Lovorn, Aundra Millet, MD  omega-3 acid ethyl esters (LOVAZA) 1 g capsule Take 2 g by mouth 2 (two) times daily.    [provider]  zinc gluconate 3.75 mg/mL SOLN Take 1 tablet by mouth daily.    [provider]      Allergies    Bupropion, Ondansetron, Sulfa antibiotics, Sulfamethoxazole-trimethoprim, Duloxetine hcl, Latex, Citalopram, Duloxetine, Levetiracetam, Linzess [linaclotide], Metformin, Nabumetone, Nitrofurantoin monohyd macro, Rosuvastatin, Sitagliptin-metformin hcl, Tizanidine, Doxycycline, and Topiramate    Review of Systems   Review of Systems A 10 point review of systems was performed and is negative unless otherwise reported in HPI.  Physical Exam Updated Vital Signs BP  117/75   Pulse 69   Temp 97.6 F (36.4 C) (Oral)   Resp 13   Ht 5\' 7"  (1.702 m)   Wt 87 kg   SpO2 95%   BMI 30.04 kg/m  Physical Exam General: Normal appearing male, lying in bed.  HEENT: Sclera anicteric, MMM, trachea midline.  Cardiology: Mildly tachycardic, no murmurs/rubs/gallops.   Resp: Normal respiratory rate and effort. CTAB, no wheezes, rhonchi, crackles.  Abd: Soft, non-tender, non-distended. No rebound tenderness or guarding. Suprapubic catheter in place with no surrounding erythema, induration. GU: Performed with RN chaperone. Partially  erect penis. No wounds, swelling, erythema, induration. Normal appearing bilateral scrotum/testicles with no TTP.   MSK: No peripheral edema or signs of trauma. Extremities without deformity or TTP. No cyanosis or clubbing. Skin: warm, dry.  Back: No CVA tenderness Neuro: A&Ox4, CNs II-XII grossly intact. 5/5 strength in BL UEs, 0/5 strength in BL LEs c/w paraplegia. Insensate in lower body.  Psych: Normal mood and affect.   ED Results / Procedures / Treatments   Labs (all labs ordered are listed, but only abnormal results are displayed) Labs Reviewed - No data to display  EKG None  Radiology No results found.  Procedures Irrigate corpus cavern, priapism  Date/Time: 10/25/2023 9:30 AM  Performed by: Loetta Rough, MD Authorized by: Loetta Rough, MD  Consent: Verbal consent obtained. Risks and benefits: risks, benefits and alternatives were discussed Consent given by: patient Patient understanding: patient states understanding of the procedure being performed Required items: required blood products, implants, devices, and special equipment available Patient identity confirmed: verbally with patient Time out: Immediately prior to procedure a "time out" was called to verify the correct patient, procedure, equipment, support staff and site/side marked as required. Local anesthesia used: yes Anesthesia: nerve block  Anesthesia: Local anesthesia used: yes Local Anesthetic: lidocaine 1% with epinephrine Anesthetic total: 5 mL  Sedation: Patient sedated: no  Patient tolerance: patient tolerated the procedure well with no immediate complications       Medications Ordered in ED Medications  acetaminophen (TYLENOL) tablet 1,000 mg (1,000 mg Oral Given 10/17/23 2040)  metoprolol tartrate (LOPRESSOR) tablet 25 mg (25 mg Oral Given 10/17/23 2039)  lidocaine-EPINEPHrine (XYLOCAINE W/EPI) 1 %-1:100000 (with pres) injection 10 mL (10 mLs Intradermal Given 10/17/23 2314)   phenylephrine 200 mcg / ml CONC. DILUTION INJ (ED / Urology USE ONLY) (50 mcg Intracavernosal Given 10/17/23 2314)    ED Course/ Medical Decision Making/ A&P                          Medical Decision Making Risk OTC drugs. Prescription drug management.    This patient presents to the ED for concern of priapism, this involves an extensive number of treatment options, and is a complaint that carries with it a high risk of complications and morbidity.  I considered the following differential and admission for this acute, potentially life threatening condition.   MDM:    Patient with recurrent priapism today as a result of spinal cord injury.  Patient [facility]. He was told to come have it drained in ED. Patient notes tachycardia that he reports is a symptom of his autonomic instability and likely discomfort from the priapism though he is insensate.  Patient requests his home metoprolol which he takes twice daily.  He is given this as well as tylenol which improved his tachycardia and his discomfort.  Will  consent patient and aspirate the priapism.  Clinical Course as of 10/25/23 0930  Wed Oct 17, 2023  2212 Patient is likely already partially detumesced based on exam. Unable to aspirate blood despite several attempts with different gauge needles/syringes. D/w urology w/ Dr. Arita Miss. She states that if he is already partially detumesced, can either try to further detumesce with phenylephrine injection or can be [facility] w/ urology f/u. D/w patient at bedside and performed shared decision making, he would like to try with phenylephrine.  [HN]    Clinical Course User Index [HN] Loetta Rough, MD    Injected 2 rounds of diluted phenylephrine with further improvement in his priapism. He is also offered valium but declines. He is given return precautions, states he will need to call his urologist in the AM to make a f/u appt.     Additional  history obtained from chart review.  External records from outside source obtained and reviewed including [facility]  Cardiac Monitoring: The patient was maintained on a cardiac monitor.  I personally viewed and interpreted the cardiac monitored which showed an underlying rhythm of: sinus tachycardia then normal sinus rhythm  Reevaluation: After the interventions noted above, I reevaluated the patient and found that they have :improved  Social Determinants of Health: Lives independently  Disposition:  DC w/ discharge instructions/return precautions. All questions answered to patient's satisfaction.    Co morbidities that complicate the patient evaluation  Past Medical History:  Diagnosis Date   Autonomic dysreflexia    Concussion    Neurogenic bladder disorder    Neurogenic bowel    PTSD (post-traumatic stress disorder)    Spastic paraplegia    Spinal cord injury of T10 vertebra (HCC)    Spinal cord injury of T9 vertebra (HCC)    Spinal cord injury, cervical region (HCC)    T5 spinal cord injury (HCC)    T6 spinal cord injury (HCC)    Testicular torsion      Medicines Meds ordered this encounter  Medications   acetaminophen (TYLENOL) tablet 1,000 mg   metoprolol tartrate (LOPRESSOR) tablet 25 mg   lidocaine-EPINEPHrine (XYLOCAINE W/EPI) 1 %-1:100000 (with pres) injection 10 mL   phenylephrine 200 mcg / ml CONC. DILUTION INJ (ED / Urology USE ONLY)    I have reviewed the patients home medicines and have made adjustments as needed  Problem List / ED Course: Problem List Items Addressed This Visit   None Visit Diagnoses       Priapism    -  Primary                   This note was created using dictation software, which may contain spelling or grammatical errors.    Loetta Rough, MD 10/25/23 [phone]

## 2023-10-17 NOTE — ED Notes (Signed)
Supplies at bedside for provider.

## 2023-10-17 NOTE — Discharge Instructions (Signed)
Thank you for coming to [facility] Emergency Department. You were seen for priapism, or prolonged erection. You were treated with injection of phenylephrine.  Please follow up with your urologist within 1 week.   Do not hesitate to return to the ED or call 911 if you experience: -Worsening symptoms -Lightheadedness, passing out -Fevers/chills -Anything else that concerns you

## 2023-10-17 NOTE — ED Triage Notes (Signed)
The pt took a valium and it is not as bad as it was but still needs attention

## 2023-10-17 NOTE — ED Triage Notes (Signed)
The pt has a pripism since this am he is a paraplegic   he has a hs od the same

## 2023-10-19 ENCOUNTER — Ambulatory Visit: Payer: 59 | Admitting: Physical Medicine & Rehabilitation

## 2023-10-23 ENCOUNTER — Encounter: Payer: Self-pay | Admitting: Physical Medicine and Rehabilitation

## 2023-11-06 ENCOUNTER — Encounter: Payer: 59 | Admitting: Physical Medicine & Rehabilitation

## 2023-11-14 ENCOUNTER — Encounter: Payer: 59 | Attending: Physical Medicine and Rehabilitation | Admitting: Physical Medicine and Rehabilitation

## 2023-11-14 ENCOUNTER — Encounter: Payer: Self-pay | Admitting: Physical Medicine and Rehabilitation

## 2023-11-14 VITALS — BP 120/81 | HR 96 | Temp 98.1°F | Ht 67.0 in | Wt 191.0 lb

## 2023-11-14 DIAGNOSIS — Z993 Dependence on wheelchair: Secondary | ICD-10-CM | POA: Diagnosis present

## 2023-11-14 DIAGNOSIS — G114 Hereditary spastic paraplegia: Secondary | ICD-10-CM | POA: Diagnosis not present

## 2023-11-14 DIAGNOSIS — Z978 Presence of other specified devices: Secondary | ICD-10-CM | POA: Diagnosis not present

## 2023-11-14 DIAGNOSIS — R252 Cramp and spasm: Secondary | ICD-10-CM | POA: Diagnosis not present

## 2023-11-14 MED ORDER — BACLOFEN 40000 MCG/20ML IT SOLN
40000.0000 ug | Freq: Once | INTRATHECAL | Status: AC
  Administered 2023-11-14: 80000 ug via INTRATHECAL

## 2023-11-14 NOTE — Patient Instructions (Signed)
  Plan:  Baclofen Pump Refill. (Synchromed II)   PUMP facing 1 to 1:30 oclock-   After informed consent and interrogation of the patient's baclofen pump to confirm residual volume, rate, concentration, etc, his abdomen was prepped with betadine. Baclofen kit was then opened at beside. Sterile technique was utilized to dawn gloves and drape the area. Using the plastic guide, I localized the fill port and inserted a needle with clamped line into the pump. Next, the remaining baclofen was removed and the volume was compatible with interrogation device (17cc- was supposed to be 16.38ml). Thereafter, baclofen was drawn into the syringe from the supplied vial. A filter was applied to syringe and excess air was expelled through the filter.. I then attached the syringe/filter to the clamped line .  Then the line was unclamped and with slow, gradual pressure I replaced  40cc of intrathecal baclofen (1 ampule of 40 mg/20 mL (2000 mcg/mL)) into the pump. The line was re-clamped and I removed the needle from the abdomen.  The area was then cleaned and dressing was applied. .  After completing the fill, the pump was reinterrogated to confirm concentration/volume/rate. We decided to keep his rate to 1151.1 and with my ptm- 1466.4 mcg/day max mcg/day. His alarm date is now 01/04/24.  Pt will follow up here the week prior to his alarm date.    2. We discussed new w/c- got Quadtro  for [facility] cushion  3. We discussed Ortho issues and his hips- Fusion?  4. Discussed Botox and bladder Botox- Dr Wynn Banker to do- gets 300 units 2x/year for Bladder - so doesn't leave much for rest of body, FYI- went over with pt.    5. F/U 1 week before 4/11- double visit if possible- with me or Dr Shearon Stalls- or Dr Royann Shivers

## 2023-11-14 NOTE — Progress Notes (Signed)
   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   New w/c is on order! Got new w/c, but keeping same Power assist- current power assist bothering him- has to repair to iwatch- getting annoying- has taken it apart frequent /multiple times- thinks that's playing a role in things.   Had a Bad AD episode- went to ED- beginning of January- Ortho did something to R hip- got tunnel vision and bad HA, spiked BP    Back spasms doing better- getting Botox soon - to do with bladder Botox  300 units for bladder- as well- Dr Wynn Banker to do Botox- legs and upper arms? Gets bladder Botox 2x/year- last done November 2024.    A lot of Ortho stuff occurring- the hips and knees and shoulders- Dr Williams Che    Plan:  Baclofen Pump Refill. (Synchromed II)   PUMP facing 1 to 1:30 oclock-   After informed consent and interrogation of the patient's baclofen pump to confirm residual volume, rate, concentration, etc, his abdomen was prepped with betadine. Baclofen kit was then opened at beside. Sterile technique was utilized to dawn gloves and drape the area. Using the plastic guide, I localized the fill port and inserted a needle with clamped line into the pump. Next, the remaining baclofen was removed and the volume was compatible with interrogation device (17cc- was supposed to be 16.3ml). Thereafter, baclofen was drawn into the syringe from the supplied vial. A filter was applied to syringe and excess air was expelled through the filter.. I then attached the syringe/filter to the clamped line .  Then the line was unclamped and with slow, gradual pressure I replaced  40cc of intrathecal baclofen (1 ampule of  40 mg/20 mL (2000 mcg/mL)) into the pump. The line was re-clamped and I removed the needle from the abdomen.  The area was then cleaned and dressing was applied. .  After completing the fill, the pump was reinterrogated to confirm concentration/volume/rate. We decided to keep his rate to 1151.1 and with my ptm- 1466.4 mcg/day max mcg/day. His alarm date is now 01/04/24.  Pt will follow up here the week prior to his alarm date.    2. We discussed new w/c- got Quadtro  for [facility] cushion  3. We discussed Ortho issues and his hips- Fusion?  4. Discussed Botox and bladder Botox- Dr Wynn Banker to do- gets 300 units 2x/year for Bladder - so doesn't leave much for rest of body, FYI- went over with pt.    5. F/U 1 week before 4/11- double visit if possible- with me or Dr Shearon Stalls- or Dr Royann Shivers   I spent a total of  41  minutes on total care today- >50% coordination of care- due to  20 minutes filling pump and rest discussing details as above-

## 2023-12-26 ENCOUNTER — Encounter: Payer: Self-pay | Admitting: Physical Medicine and Rehabilitation

## 2023-12-26 ENCOUNTER — Encounter: Payer: 59 | Attending: Physical Medicine and Rehabilitation | Admitting: Physical Medicine and Rehabilitation

## 2023-12-26 VITALS — BP 136/81 | HR 76 | Ht 67.0 in | Wt 194.0 lb

## 2023-12-26 DIAGNOSIS — G8222 Paraplegia, incomplete: Secondary | ICD-10-CM | POA: Insufficient documentation

## 2023-12-26 DIAGNOSIS — R252 Cramp and spasm: Secondary | ICD-10-CM | POA: Diagnosis present

## 2023-12-26 DIAGNOSIS — Z978 Presence of other specified devices: Secondary | ICD-10-CM | POA: Insufficient documentation

## 2023-12-26 DIAGNOSIS — G114 Hereditary spastic paraplegia: Secondary | ICD-10-CM | POA: Diagnosis not present

## 2023-12-26 MED ORDER — BACLOFEN 40000 MCG/20ML IT SOLN
40000.0000 ug | Freq: Once | INTRATHECAL | Status: AC
  Administered 2023-12-26: 40000 ug via INTRATHECAL

## 2023-12-26 NOTE — Progress Notes (Signed)
 Baclofen pump titration    Patients father passed away 3 weeks ago, which has been very upsetting. He also notes "my body is against me"; he is having more LE spasms. His urologist recently did a penile implant and circumcision, which has been difficult. He also notes in the next month he is getting another knee surgery. He is having lots of back spasms that are very painful and is using all his boluses, with benefit, but symptoms still uncontrolled.   After informed consent and interrogation of the patient's baclofen pump to confirm residual volume, rate, concentration, etc, his abdomen was prepped with betadine. Baclofen kit was then opened at beside. Sterile technique was utilized to dawn gloves and drape the area. Using the plastic guide, I localized the fill port and inserted a needle with clamped line into the pump. Next, the remaining baclofen was removed and the volume was compatible with interrogation device (estimated 14.3 ml, actual 15.0 ml ).  Thereafter, baclofen was drawn into the syringe from the supplied vial. A filter was applied to syringe and excess air was expelled through the filter. I then attached the syringe/filter to the clamped line . Then the line was unclamped and with slow, gradual pressure I replaced 40cc of intrathecal baclofen (2 ampule of 40 mg/20 mL (2000 mcg/ml)) into the pump. The line was re-clamped and I removed the needle from the abdomen. The area was then cleaned and dressing was applied.    Pump position notes: facing 3 pm; button approximately 3/4 inch medial to scar   After completing the fill, the pump was reinterrogated and updated to confirm the concentration/volume/rate of 1,169.3mcg/day (increase 5%) continuous with 60.1 mcg boluses 6x per day for total 1520.7 mcg/day.  His new alarm date is now 02/13/24.

## 2023-12-27 ENCOUNTER — Encounter: Payer: Self-pay | Admitting: Physical Medicine & Rehabilitation

## 2023-12-27 ENCOUNTER — Encounter (HOSPITAL_BASED_OUTPATIENT_CLINIC_OR_DEPARTMENT_OTHER): Admitting: Physical Medicine & Rehabilitation

## 2023-12-27 VITALS — BP 143/94 | HR 88 | Ht 67.0 in

## 2023-12-27 DIAGNOSIS — G8222 Paraplegia, incomplete: Secondary | ICD-10-CM

## 2023-12-27 DIAGNOSIS — R252 Cramp and spasm: Secondary | ICD-10-CM | POA: Diagnosis not present

## 2023-12-27 MED ORDER — ONABOTULINUMTOXINA 100 UNITS IJ SOLR
300.0000 [IU] | Freq: Once | INTRAMUSCULAR | Status: AC
  Administered 2023-12-27: 300 [IU] via INTRAMUSCULAR

## 2023-12-27 NOTE — Progress Notes (Signed)
  Botox Injection for spasticity using needle EMG guidance  Dilution: 50 Units/ml Indication: Severe spasticity which interferes with ADL,mobility and/or  hygiene and is unresponsive to medication management and other conservative care Informed consent was obtained after describing risks and benefits of the procedure with the patient. This includes bleeding, bruising, infection, excessive weakness, or medication side effects. A REMS form is on file and signed. Needle: 27g 1" needle electrode Number of units per muscle  Rectus fem 25U B Adductor longus 25 units B  Adductor magnus 25 units B Semimembranosus 50 B/L Semitendinosis 25 B/L  All injections were done after obtaining appropriate EMG activity and after negative drawback for blood. The patient tolerated the procedure well. Post procedure instructions were given. A followup appointment was made.

## 2024-01-28 ENCOUNTER — Encounter: Attending: Physical Medicine and Rehabilitation | Admitting: Physical Medicine and Rehabilitation

## 2024-01-28 ENCOUNTER — Encounter: Payer: Self-pay | Admitting: Physical Medicine and Rehabilitation

## 2024-01-28 VITALS — BP 132/84 | HR 73 | Ht 67.0 in | Wt 194.0 lb

## 2024-01-28 DIAGNOSIS — G114 Hereditary spastic paraplegia: Secondary | ICD-10-CM | POA: Insufficient documentation

## 2024-01-28 DIAGNOSIS — M62838 Other muscle spasm: Secondary | ICD-10-CM | POA: Diagnosis not present

## 2024-01-28 DIAGNOSIS — Z993 Dependence on wheelchair: Secondary | ICD-10-CM | POA: Diagnosis not present

## 2024-01-28 DIAGNOSIS — N319 Neuromuscular dysfunction of bladder, unspecified: Secondary | ICD-10-CM | POA: Insufficient documentation

## 2024-01-28 DIAGNOSIS — R252 Cramp and spasm: Secondary | ICD-10-CM | POA: Diagnosis present

## 2024-01-28 DIAGNOSIS — Z978 Presence of other specified devices: Secondary | ICD-10-CM | POA: Insufficient documentation

## 2024-01-28 MED ORDER — CYCLOBENZAPRINE HCL 10 MG PO TABS: 5.0000 mg | ORAL_TABLET | Freq: Three times a day (TID) | ORAL | 1 refills | Status: AC | PRN

## 2024-01-28 NOTE — Patient Instructions (Signed)
 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  Here for f/u  on SCI and ITB pump refill     Has ITB pump refill with 2000 mcg/ml concentration on 02/05/24- with Dr Debarah Faes.   2.  R knee planned for "being cleaned out"- arthroscopic surgery"- but hasn't been scheduled; surgery also planned in remote future- trying to see if knees can be done and B/L hamstring release by Peds Ortho at [facility]   3. Please fill pt's ITB pump at next appointment and also increase dose for Pump by 10% since cannot do today- or will move pump refill date.     4.  Tennis elbow splints- -can be helpful- My favorite brand Episport epicondylitis clasp- because of foam padding on it- velcro on the inside of just below elbow    5. Cyclobenzaprine- 5-10 mg as needed up to 3x/day- # 30-    6. Dr Luster Salters in retiring-  moving to another doctor- his Urologist.    7. If Ortho has difficulty finding what doctor to refer to at [facility] for Hamstring release, , let me know- and I can try to help.  Usually send to Dr Ahmad Hotter or Dr Elvan Hamel.    8. F/U in 3months- double appt- HSP.

## 2024-01-28 NOTE — Progress Notes (Deleted)
  Subjective:     Patient ID: Juan Blake, male   DOB: [birth year], 33 y.o.   MRN: [id number]  HPI   Review of Systems  Musculoskeletal:  Positive for back pain and gait problem.       Pain in both arms  All other systems reviewed and are negative.      Objective:   Physical Exam     Assessment:     ***    Plan:     ***

## 2024-01-28 NOTE — Progress Notes (Signed)
 Subjective:    Patient ID: Juan Blake, male    DOB: 12/30/1990, 33 y.o.   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  Here for f/u  on SCI and ITB pump refill     Legs are extremely tight- even his PT has noticed things tighter.  Potentially going in for knee surgery and discussed hamstring lengthening.  To get more more ROM- looking into process- Doesn't think Botox  is working right now either- in his legs. No improvement in spasticity.   Last Botox  12/27/23 by Dr Sharl Davies- - dose was same per pt.  Rectus fem 25U B Adductor longus 25 units B   Adductor magnus 25 units B Semimembranosus 50 B/L Semitendinosis 25 B/L    Having tingling and numbness in arms- bothers him in neck and down into shoulders.  Started last week.   Arms were numb when woke up last week and has been getting associated HA's as well.   Doesn't think had a fall, trauma, anything that would trigger it- hasn't changed his pillow- sleeps with 2 pillows.    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   Also got hearing aids since last saw me- wearing them     Pain Inventory Average Pain depend on location may be 5 Pain Right Now 4 My pain is  constant spasms  In the last 24 hours, has pain interfered with the following? General activity 1 Relation with others 0 Enjoyment of life 0 What TIME of day is your pain at its worst? morning , daytime, evening, and night Sleep (in general) Fair  Pain is worse with: unsure Pain improves with:  unknown Relief from Meds: 8  History reviewed. No pertinent family history. Social History   Socioeconomic History   Marital status: Single    Spouse name: Not on file   Number of children:  Not on file   Years of education: Not on file   Highest education level: Not on file  Occupational History   Not on file  Tobacco Use   Smoking status: Never   Smokeless tobacco: Never  Vaping Use   Vaping status: Never Used  Substance and Sexual Activity   Alcohol use: No   Drug use: No   Sexual activity: Never  Other Topics Concern   Not on file  Social History Narrative   Not on file   Social Drivers of Health   Financial Resource Strain: Low Risk  (06/26/2022)   Received from [facility]   Overall Financial Resource Strain (CARDIA)    Difficulty of Paying Living Expenses: Not hard at all  Food Insecurity: Low Risk  (12/03/2023)   Received from [facility]   Hunger Vital Sign    Worried About Running Out of Food in the Last Year: Never true    Ran Out of Food in the Last Year: Never true  Transportation Needs: No Transportation Needs (12/03/2023)   Received from [facility]    In the past 12 months, has lack of reliable transportation kept you from medical appointments, meetings, work or from getting things needed for daily living? : No  Physical Activity: Inactive (06/26/2022)   Received from [facility]  Exercise Vital Sign    Days of Exercise per Week: 0 days    Minutes of Exercise per Session: 90 min  Stress: No Stress Concern Present (06/26/2022)   Received from [facility] - Occupational Stress Questionnaire    Feeling of Stress : Not at all  Social Connections: Unknown (07/08/2023)   Received from [facility]   Social Network    Social Network: Not on file   Past Surgical History:  Procedure Laterality Date   CALCANEAL OSTEOTOMY W/ INTERNAL FIXATION     FOOT SURGERY     GASTROCNEMIUS RECESSION     hip surgery     KNEE SURGERY     NECK SURGERY     OSTEOTOMY TARSAL     POSTERIOR FUSION CERVICAL SPINE     TESTICLE SURGERY     TYMPANOSTOMY TUBE PLACEMENT     Past Surgical History:   Procedure Laterality Date   CALCANEAL OSTEOTOMY W/ INTERNAL FIXATION     FOOT SURGERY     GASTROCNEMIUS RECESSION     hip surgery     KNEE SURGERY     NECK SURGERY     OSTEOTOMY TARSAL     POSTERIOR FUSION CERVICAL SPINE     TESTICLE SURGERY     TYMPANOSTOMY TUBE PLACEMENT     Past Medical History:  Diagnosis Date   Autonomic dysreflexia    Concussion    Neurogenic bladder disorder    Neurogenic bowel    PTSD (post-traumatic stress disorder)    Spastic paraplegia    Spinal cord injury of T10 vertebra (HCC)    Spinal cord injury of T9 vertebra (HCC)    Spinal cord injury, cervical region (HCC)    T5 spinal cord injury (HCC)    T6 spinal cord injury (HCC)    Testicular torsion    BP 132/84   Pulse 73   Ht 5\' 7"  (1.702 m)   Wt 194 lb (88 kg)   SpO2 95%   BMI 30.38 kg/m   Opioid Risk Score:   Fall Risk Score:  `1  Depression screen [facility] 2/9     01/28/2024    9:16 AM 12/27/2023   11:31 AM 12/26/2023   12:58 PM 11/14/2023    9:35 AM 10/08/2023    2:29 PM 08/15/2023    9:46 AM 07/17/2023   11:27 AM  Depression screen PHQ 2/9  Decreased Interest 0 0 0 0 0 0 0  Down, Depressed, Hopeless 0 0 0 0 0 0 0  PHQ - 2 Score 0 0 0 0 0 0 0    Review of Systems  Musculoskeletal:  Positive for back pain, gait problem and neck pain.       Pain in both arms  All other systems reviewed and are negative.      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  Here for f/u  on SCI and ITB pump refill     Has ITB pump refill with 2000 mcg/ml concentration on 02/05/24- with Dr Debarah Faes.   2.  R knee planned for "being cleaned out"- arthroscopic surgery"- but hasn't been scheduled; surgery also planned in remote future- trying to see if knees can be done and B/L hamstring release by Peds Ortho at [facility]   3. Please fill pt's ITB pump at next appointment and also increase dose for Pump by 10% since cannot do today- or will move pump refill date.     4.  Tennis elbow splints- -can be helpful- My favorite brand Episport epicondylitis clasp- because of foam padding on it- velcro on the inside of just below elbow    5. Cyclobenzaprine- 5-10 mg as needed up to 3x/day- # 30-    6. Dr Luster Salters in retiring-  moving to another doctor- his Urologist.    7. If Ortho has difficulty finding what doctor to refer to at [facility] for Hamstring release, , let me know- and I can try to help.  Usually send to Dr Ahmad Hotter or Dr Elvan Hamel.    8. F/U in 3 months- double appt- HSP.     I spent a total of  31  minutes on total care today- >50% coordination of care- due to d/w pt about probalbe ulnar neuropathy as well as ITB pump, spasticity and hamstring release.

## 2024-02-05 ENCOUNTER — Encounter: Admitting: Physical Medicine & Rehabilitation

## 2024-02-05 ENCOUNTER — Encounter: Payer: Self-pay | Admitting: Physical Medicine & Rehabilitation

## 2024-02-05 VITALS — BP 144/97 | HR 93 | Ht 67.0 in | Wt 194.0 lb

## 2024-02-05 DIAGNOSIS — R252 Cramp and spasm: Secondary | ICD-10-CM

## 2024-02-05 DIAGNOSIS — G114 Hereditary spastic paraplegia: Secondary | ICD-10-CM | POA: Diagnosis not present

## 2024-02-05 MED ORDER — BACLOFEN 40000 MCG/20ML IT SOLN
80000.0000 ug | Freq: Once | INTRATHECAL | Status: AC
  Administered 2024-02-05: 80000 ug via INTRATHECAL

## 2024-02-05 NOTE — Progress Notes (Signed)
 Baclofen  pump titration / Refill  Pt reports he continues to have LE muscle spasms/tightness particularly in his hamstrings.  Reports benefit with baclofen  pump boluses.  He is also following with Dr. Sharl Davies for Botox  injections.  Dr. Barbra Ley had discussed increase in baclofen  pump dosage, patient would like to do a little bit less than 10% today.    After informed consent and interrogation of the patient's baclofen  pump to confirm residual volume, rate, concentration, etc, his abdomen was prepped with betadine. Baclofen  kit was then opened at beside. Sterile technique was utilized to dawn gloves and drape the area. Using the plastic guide, I localized the fill port and inserted a needle with clamped line into the pump. Next, the remaining baclofen  was removed and the volume was compatible with interrogation device (estimated 14.2 ml, actual 17.0 ml ).  Thereafter, baclofen  was drawn into the syringe from the supplied vial. A filter was applied to syringe and excess air was expelled through the filter. I then attached the syringe/filter to the clamped line . Then the line was unclamped and with slow, gradual pressure I replaced 40cc of intrathecal baclofen  (2 ampule of 40 mg/20 mL (2000 mcg/ml)) into the pump. The line was re-clamped and I removed the needle from the abdomen. The area was then cleaned and dressing was applied.    Pump position notes: facing 3 pm; button approximately 3/4 inch medial to scar   After completing the fill, the pump was reinterrogated and updated to confirm the concentration/volume/rate of 1,250.14mcg/day (increase 6.9%) continuous with 60.1 mcg boluses 6x per day for total 1600.5 mcg/day.  His new alarm date is now 03/23/24.     Addendum: pt scheduled for next refill

## 2024-02-08 ENCOUNTER — Telehealth: Payer: Self-pay | Admitting: Physical Medicine and Rehabilitation

## 2024-02-08 DIAGNOSIS — M62838 Other muscle spasm: Secondary | ICD-10-CM

## 2024-02-08 DIAGNOSIS — G114 Hereditary spastic paraplegia: Secondary | ICD-10-CM

## 2024-02-08 DIAGNOSIS — M629 Disorder of muscle, unspecified: Secondary | ICD-10-CM

## 2024-02-08 NOTE — Telephone Encounter (Signed)
 Pt needs referral to Dr Ahmad Hotter at [facility] for  hamstring lengthening- since he's one of the few docs that do this- he's a peds ortho- and has done for [facility] patients-   Will place referral- HAS to be Dr Ahmad Hotter unless he suggests someone else- thanks

## 2024-02-08 NOTE — Addendum Note (Signed)
 Addended by: Toure Edmonds on: 02/08/2024 01:49 PM   Modules accepted: Orders

## 2024-02-08 NOTE — Telephone Encounter (Signed)
 Patient is calling to get referral to ortho for hand strengthing.  He mentioned the one at [facility] Dr. Ahmad Hotter.

## 2024-03-13 ENCOUNTER — Encounter: Payer: Self-pay | Admitting: Physical Medicine & Rehabilitation

## 2024-03-13 ENCOUNTER — Encounter: Attending: Physical Medicine and Rehabilitation | Admitting: Physical Medicine & Rehabilitation

## 2024-03-13 VITALS — BP 134/80 | HR 81 | Ht 67.0 in

## 2024-03-13 DIAGNOSIS — G8222 Paraplegia, incomplete: Secondary | ICD-10-CM | POA: Diagnosis present

## 2024-03-13 DIAGNOSIS — Z978 Presence of other specified devices: Secondary | ICD-10-CM | POA: Diagnosis present

## 2024-03-13 DIAGNOSIS — M629 Disorder of muscle, unspecified: Secondary | ICD-10-CM | POA: Diagnosis present

## 2024-03-13 DIAGNOSIS — R252 Cramp and spasm: Secondary | ICD-10-CM

## 2024-03-13 MED ORDER — BACLOFEN 40000 MCG/20ML IT SOLN
40000.0000 ug | Freq: Once | INTRATHECAL | Status: DC
  Administered 2024-03-13: 40000 ug via INTRATHECAL

## 2024-03-13 NOTE — Progress Notes (Signed)
 Baclofen  pump titration / Refill  Pt reports he continues to have LE muscle spasms/tightness particularly in his hamstrings.  Reports benefit with baclofen  pump boluses but no change since last visit.  He is considering tendon lengthening surgery. He is moving to new home soon.  He is also following with Dr. Sharl Davies for Botox  injections.  He would like to keep baclofen  pump dose the same today.     After informed consent and interrogation of the patient's baclofen  pump to confirm residual volume, rate, concentration, etc, his abdomen was prepped with betadine. Baclofen  kit was then opened at beside. Sterile technique was utilized to dawn gloves and drape the area. Using the plastic guide, I localized the fill port and inserted a needle with clamped line into the pump. Next, the remaining baclofen  was removed and the volume was compatible with interrogation device (estimated 15.7 ml, actual 17.0 ml ).  Thereafter, baclofen  was drawn into the syringe from the supplied vial. A filter was applied to syringe and excess air was expelled through the filter. I then attached the syringe/filter to the clamped line . Then the line was unclamped and with slow, gradual pressure I replaced 40cc of intrathecal baclofen  (2 ampule of 40 mg/20 mL (2000 mcg/ml)) into the pump. The line was re-clamped and I removed the needle from the abdomen. The area was then cleaned and dressing was applied.    Pump position notes: facing 3 pm; button approximately 3/4 inch medial to scar   After completing the fill, the pump was reinterrogated and updated to confirm the concentration/volume/rate of 1,250.62mcg/day continuous with 60.1 mcg boluses 6x per day for total 1600.5 mcg/day.  His new alarm date is now 04/29/24.     Addendum: pt already scheduled for next refill

## 2024-03-14 MED ORDER — BACLOFEN 40000 MCG/20ML IT SOLN
40000.0000 ug | Freq: Once | INTRATHECAL | Status: AC
  Administered 2024-03-13: 40000 ug via INTRATHECAL

## 2024-03-14 MED ORDER — BACLOFEN 40000 MCG/20ML IT SOLN: 40000.0000 ug | Freq: Once | INTRATHECAL | Status: AC

## 2024-03-17 NOTE — Patient Instructions (Signed)
 Baclofen  dose increased 10 percent

## 2024-03-21 ENCOUNTER — Ambulatory Visit: Admitting: Physical Medicine & Rehabilitation

## 2024-03-27 ENCOUNTER — Encounter: Admitting: Physical Medicine & Rehabilitation

## 2024-04-01 ENCOUNTER — Encounter: Payer: Self-pay | Admitting: Physical Medicine & Rehabilitation

## 2024-04-01 ENCOUNTER — Encounter: Admitting: Physical Medicine & Rehabilitation

## 2024-04-15 ENCOUNTER — Ambulatory Visit: Admitting: Physical Medicine & Rehabilitation

## 2024-04-18 ENCOUNTER — Encounter: Payer: Self-pay | Admitting: Physical Medicine & Rehabilitation

## 2024-04-18 ENCOUNTER — Encounter: Attending: Physical Medicine and Rehabilitation | Admitting: Physical Medicine & Rehabilitation

## 2024-04-18 VITALS — BP 132/88 | HR 84 | Ht 67.0 in | Wt 189.0 lb

## 2024-04-18 DIAGNOSIS — G8222 Paraplegia, incomplete: Secondary | ICD-10-CM | POA: Diagnosis not present

## 2024-04-18 DIAGNOSIS — R252 Cramp and spasm: Secondary | ICD-10-CM | POA: Insufficient documentation

## 2024-04-18 MED ORDER — BACLOFEN 40000 MCG/20ML IT SOLN
40000.0000 ug | Freq: Once | INTRATHECAL | Status: AC
  Administered 2024-04-18: 40000 ug via INTRATHECAL

## 2024-04-18 MED ORDER — BACLOFEN 40000 MCG/20ML IT SOLN
40000.0000 ug | Freq: Once | INTRATHECAL | Status: AC
Start: 2024-04-18 — End: 2024-04-18
  Administered 2024-04-18: 40000 ug via INTRATHECAL

## 2024-04-18 NOTE — Progress Notes (Signed)
 Baclofen  pump titration / Refill  Patient is here for baclofen  pump refill today.  Reports continued tightness particular in his hamstrings.  Reports he has follow-up with [facility] who is planning to do [facility] for this issue.  If this does not work surgery may be considered for tendon lengthening.  Continues to have benefit baclofen  pump and will use occasional boluses.     After informed consent and interrogation of the patient's baclofen  pump to confirm residual volume, rate, concentration, etc, his abdomen was prepped with betadine. Baclofen  kit was then opened at beside. Sterile technique was utilized to dawn gloves and drape the area. Using the plastic guide, I localized the fill port and inserted a needle with clamped line into the pump. Next, the remaining baclofen  was removed and the volume was compatible with interrogation device (estimated 15.7 ml, actual 17.0 ml ).  Thereafter, baclofen  was drawn into the syringe from the supplied vial. A filter was applied to syringe and excess air was expelled through the filter. I then attached the syringe/filter to the clamped line . Then the line was unclamped and with slow, gradual pressure I replaced 40cc of intrathecal baclofen  (2 ampule of 40 mg/20 mL (2000 mcg/ml)) into the pump. The line was re-clamped and I removed the needle from the abdomen. The area was then cleaned and dressing was applied.    Pump position notes: facing 3 pm; button approximately 3/4 inch medial to scar   After completing the fill, the pump was reinterrogated and updated to confirm the concentration/volume/rate of 1,250.37mcg/day continuous with 60.1 mcg boluses 6x per day for total 1600.5 mcg/day.  His new alarm date is now 06/04/24.     Addendum: pt already scheduled for next refill

## 2024-04-29 ENCOUNTER — Ambulatory Visit: Admitting: Physical Medicine & Rehabilitation

## 2024-05-06 ENCOUNTER — Encounter: Admitting: Physical Medicine & Rehabilitation

## 2024-05-09 ENCOUNTER — Ambulatory Visit: Admitting: Physical Medicine and Rehabilitation

## 2024-05-26 ENCOUNTER — Encounter: Payer: Self-pay | Admitting: Physical Medicine and Rehabilitation

## 2024-05-30 ENCOUNTER — Encounter: Attending: Physical Medicine and Rehabilitation | Admitting: Physical Medicine and Rehabilitation

## 2024-05-30 ENCOUNTER — Encounter: Payer: Self-pay | Admitting: Physical Medicine and Rehabilitation

## 2024-05-30 VITALS — BP 132/92 | HR 92 | Ht 67.0 in | Wt 189.0 lb

## 2024-05-30 DIAGNOSIS — G114 Hereditary spastic paraplegia: Secondary | ICD-10-CM | POA: Diagnosis present

## 2024-05-30 DIAGNOSIS — Z9689 Presence of other specified functional implants: Secondary | ICD-10-CM | POA: Diagnosis present

## 2024-05-30 MED ORDER — BACLOFEN 40000 MCG/20ML IT SOLN
80000.0000 ug | Freq: Once | INTRATHECAL | Status: AC
  Administered 2024-05-30: 80000 ug via INTRATHECAL

## 2024-05-30 NOTE — Addendum Note (Signed)
 Addended by: Elyanna Wallick W on: 05/30/2024 03:53 PM   Modules accepted: Orders

## 2024-05-30 NOTE — Progress Notes (Signed)
 Baclofen  Pump Refill. (Synchromed II)    After informed consent and interrogation of the patient's baclofen  pump to confirm residual volume, rate, concentration, etc, his abdomen was prepped with betadine. Baclofen  kit was then opened at beside. Sterile technique was utilized to dawn gloves and drape the area. Using the plastic guide, I localized the fill port and inserted a needle with clamped line into the pump. Next, the remaining baclofen  was removed and the volume was compatible with interrogation device (was 12.5, but didn't get out all medicine) (9ml). Thereafter, baclofen  was drawn into the syringe from the supplied vial. A filter was applied to syringe and excess air was expelled through the filter.. I then attached the syringe/filter to the clamped line .  Then the line was unclamped and with slow, gradual pressure I replaced  40cc of intrathecal baclofen  (1 ampule of 37 (wouldn't let me put 40 ml- only 37) mg/16ml- 2000 mg/ml)) into the pump. The line was re-clamped and I removed the needle from the abdomen.  The area was then cleaned and dressing was applied. .  After completing the fill, the pump was reinterrogated to confirm concentration/volume/rate. We decided to maintain his rate with self given meds as well- max 1650 mcg/day. His alarm date is now 07/12/24.  Pt will follow up here the week prior to his alarm date.    Asked pt to make multiple appointments- since is stable- every 5 weeks.

## 2024-06-10 ENCOUNTER — Encounter: Admitting: Physical Medicine & Rehabilitation

## 2024-06-19 ENCOUNTER — Encounter: Payer: Self-pay | Admitting: *Deleted

## 2024-06-19 ENCOUNTER — Encounter: Admitting: Physical Medicine & Rehabilitation

## 2024-06-19 ENCOUNTER — Encounter: Payer: Self-pay | Admitting: Physical Medicine & Rehabilitation

## 2024-06-19 VITALS — Ht 67.0 in

## 2024-06-19 NOTE — Patient Instructions (Signed)
 Please call us  once you get your November bladder Botox  scheduled so that we can coordinate within a day or 2

## 2024-06-19 NOTE — Progress Notes (Signed)
 Patient gets Botox  for bladder last injection was in August 2025.  He has sedation for this procedure. We discussed that if he received Botox  today there would be a greater risk for developing antibodies to Botox .  We discussed the importance of coordinating with urology at least within a day or 2 to minimize risk of increased antibodies. Once he gets his urology appointment we can try scheduling the lower extremity Botox  injection

## 2024-07-10 ENCOUNTER — Telehealth: Payer: Self-pay | Admitting: Physical Medicine and Rehabilitation

## 2024-07-10 NOTE — Telephone Encounter (Signed)
 Pt requested for a refill on Daclofen for his pump today instead of tomorrow

## 2024-07-11 ENCOUNTER — Encounter: Attending: Physical Medicine and Rehabilitation | Admitting: Physical Medicine and Rehabilitation

## 2024-07-11 ENCOUNTER — Encounter: Payer: Self-pay | Admitting: Physical Medicine and Rehabilitation

## 2024-07-11 VITALS — BP 143/93 | HR 84

## 2024-07-11 DIAGNOSIS — Z993 Dependence on wheelchair: Secondary | ICD-10-CM | POA: Diagnosis present

## 2024-07-11 DIAGNOSIS — G114 Hereditary spastic paraplegia: Secondary | ICD-10-CM | POA: Diagnosis not present

## 2024-07-11 DIAGNOSIS — M62838 Other muscle spasm: Secondary | ICD-10-CM | POA: Diagnosis present

## 2024-07-11 DIAGNOSIS — Z978 Presence of other specified devices: Secondary | ICD-10-CM | POA: Insufficient documentation

## 2024-07-11 MED ORDER — BACLOFEN 40000 MCG/20ML IT SOLN
40000.0000 ug | Freq: Once | INTRATHECAL | Status: AC
  Administered 2024-07-11: 40000 ug via INTRATHECAL

## 2024-07-11 NOTE — Patient Instructions (Signed)
  Baclofen  Pump Refill. (Synchromed II)   At 1:30-2 oclock on abdomen  After informed consent and interrogation of the patient's baclofen  pump to confirm residual volume, rate, concentration, etc, his abdomen was prepped with betadine. Baclofen  kit was then opened at beside. Sterile technique was utilized to dawn gloves and drape the area. Using the plastic guide, I localized the fill port and inserted a needle with clamped line into the pump. Next, the remaining baclofen  was removed and the volume was compatible with interrogation device (9 cc is what was supposed to be there- had 12 ml- since not using all his boluses most likely. ). Thereafter, baclofen  was drawn into the syringe from the supplied vial. A filter was applied to syringe and excess air was expelled through the filter.. I then attached the syringe/filter to the clamped line .  Then the line was unclamped and with slow, gradual pressure I replaced  40cc of intrathecal baclofen  (1 ampule of 40 mg/20 mL (2000 mcg/mL)) into the pump. The line was re-clamped and I removed the needle from the abdomen.  The area was then cleaned and dressing was applied. .  After completing the fill, the pump was reinterrogated to confirm concentration/volume/rate. We decided to increase his pump slightly to 1301. 5- ml- went up 4%  mcg/day however has 6 boluses /day-10 minute lockout and total dose of 1651/day max. His alarm date is now 08/26/24.  Pt will follow up here the week prior to his alarm date.     F/U scheduled 11/20- however can try to move later to 7 days before due.

## 2024-07-11 NOTE — Addendum Note (Signed)
 Addended by: JAMA FLEMING T on: 07/11/2024 03:30 PM   Modules accepted: Orders

## 2024-07-11 NOTE — Progress Notes (Signed)
  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  Here for f/u  on SCI and ITB pump refill   Saw Dr Carilyn Seeing Urology 10/20- so needs to schedule to have Botox  for bladder and next day have Dr Carilyn.  Getting surgery for shoulder to get cleaned out- 11/11-    Baclofen  Pump Refill. (Synchromed II)   At 1:30-2 oclock on abdomen  After informed consent and interrogation of the patient's baclofen  pump to confirm residual volume, rate, concentration, etc, his abdomen was prepped with betadine. Baclofen  kit was then opened at beside. Sterile technique was utilized to dawn gloves and drape the area. Using the plastic guide, I localized the fill port and inserted a needle with clamped line into the pump. Next, the remaining baclofen  was removed and the volume was compatible with interrogation device (9 cc is what was supposed to be there- had 12 ml- since not using all his boluses most likely. ). Thereafter, baclofen  was drawn into the syringe from the supplied vial. A filter was applied to syringe and excess air was expelled through the filter.. I then attached the syringe/filter to the clamped line .  Then the line was unclamped and with slow, gradual pressure I replaced  40cc of intrathecal baclofen  (1 ampule of 40 mg/20 mL (2000 mcg/mL)) into the pump. The line was re-clamped and I removed the needle from the abdomen.  The area was then cleaned and dressing was applied. .  After completing the fill, the pump was reinterrogated to confirm concentration/volume/rate. We decided to increase his pump slightly to 1301. 5- ml- went up 4%  mcg/day however has 6 boluses /day-10 minute lockout and total dose of 1651/day max. His alarm date is now 08/26/24.  Pt will follow up here the week prior to his alarm date.     F/U scheduled 11/20-  however can try to move later to 7 days before due.   2. Titrated dose- we discussed that will likely have to increase dose some over the winter. Esp due to increased spasms and pain.   3/ F/U as above- we discussed in depth and changed dose based on back spasms.    I spent a total of 39   minutes on total care today- >50% coordination of care- due to  25 minutes discussing spasticity and management of this and shoulder surgery- rest spent on ITB pump refill and reprogramming

## 2024-07-14 ENCOUNTER — Telehealth: Payer: Self-pay | Admitting: Physical Medicine & Rehabilitation

## 2024-07-14 NOTE — Telephone Encounter (Signed)
 Patient

## 2024-07-14 NOTE — Telephone Encounter (Signed)
 Pt called and was trying to make a appt with kirsteins 10/31 for botox  bc he said he needed to  line it up with another botox  appt. He stated this one would be for the leg. Can someone from clinical call to see what exactly he is needing us  to do

## 2024-07-16 ENCOUNTER — Telehealth: Payer: Self-pay | Admitting: Physical Medicine and Rehabilitation

## 2024-07-16 NOTE — Telephone Encounter (Signed)
 Patient needed to reschedule his botox  appt with Dr. Carilyn.  We have him rescheduled for 10/28 with Dr. Carilyn and he has his appt with Urologist on 10/31.  Will this time frame work to get his botox  with you?  Please let me know.  Thank you.

## 2024-07-16 NOTE — Telephone Encounter (Signed)
 Patient is having real bad leg spasms at night.  He said that his dog is laying on him because he is having them so bad.  He is going to reach out to his neurologist, due to you being out of the office until Monday.  I did tell him I would send you this message.

## 2024-07-21 MED ORDER — PREGABALIN 25 MG PO CAPS: 25.0000 mg | ORAL_CAPSULE | Freq: Two times a day (BID) | ORAL | 5 refills | Status: AC

## 2024-07-21 NOTE — Telephone Encounter (Signed)
 Pt reports having stabbing pain in mid back- like being stabbed with electricity or a knife- just started since I saw him this last week to refill and increase his pump   Has tried Gabapentin in past and Duloxetine and Amitriptyline with bad side effects- they are on allergy list.   Sounds like nerve pain, norco helped slightly for pain and spasms.  But not great- I don't think increasing it will help, since it sounds neuropathic like nerve pain.   Probably the pain is setting his spasticity off, so that's why oral baclofen  not helping pain, mainly at night.   Will try Lyrica 25 mg at bedtime x 4 days, then 2x/day x 4 days, then 3x/day x 4 days, then 50 mg 2x/day- for nerve pain- he's tried Lyrica in past, but doesn't remember side effects with it- not on allergy list.    Explained will start lower dose than normal and go from there.  Since he's used basically all the nerve pain meds that are out there.   Also asked him to check with Neurology about nerve/back pain- and see if they can see sooner than I can.

## 2024-07-21 NOTE — Addendum Note (Signed)
 Addended by: Lexi Conaty on: 07/21/2024 02:34 PM   Modules accepted: Orders

## 2024-07-22 ENCOUNTER — Encounter (HOSPITAL_BASED_OUTPATIENT_CLINIC_OR_DEPARTMENT_OTHER): Admitting: Physical Medicine & Rehabilitation

## 2024-07-22 ENCOUNTER — Encounter: Payer: Self-pay | Admitting: Physical Medicine & Rehabilitation

## 2024-07-22 VITALS — BP 135/91 | HR 100 | Ht 67.0 in | Wt 199.0 lb

## 2024-07-22 DIAGNOSIS — G114 Hereditary spastic paraplegia: Secondary | ICD-10-CM

## 2024-07-22 MED ORDER — SODIUM CHLORIDE (PF) 0.9 % IJ SOLN
6.0000 mL | Freq: Once | INTRAMUSCULAR | Status: AC
  Administered 2024-07-22: 6 mL via INTRAVENOUS

## 2024-07-22 MED ORDER — ONABOTULINUMTOXINA 100 UNITS IJ SOLR
100.0000 [IU] | Freq: Once | INTRAMUSCULAR | Status: AC
  Administered 2024-07-22: 100 [IU] via INTRAMUSCULAR

## 2024-07-22 NOTE — Progress Notes (Signed)
  Botox  Injection for spasticity using needle EMG guidance  Dilution: 50 Units/ml Indication: Severe spasticity which interferes with ADL,mobility and/or  hygiene and is unresponsive to medication management and other conservative care Informed consent was obtained after describing risks and benefits of the procedure with the patient. This includes bleeding, bruising, infection, excessive weakness, or medication side effects. A REMS form is on file and signed. Needle: 27g 1 needle electrode Number of units per muscle  Rectus fem 25U B Adductor longus 25 units B  Adductor magnus 25 units B Semimembranosus 50 B/L Semitendinosis 25 B/L  Biceps Fem 25 B/L All injections were done after obtaining appropriate EMG activity and after negative drawback for blood. The patient tolerated the procedure well. Post procedure instructions were given. A followup appointment was made.   We discussed further adjustment of Botox  dosing depending on muscle group spasticity.  Specifically he may need to increase dosing to the hamstring muscle groups.  His bladder Botox  will be on Friday.  We are trying to coordinate with urology for the next round of Botox  try to get it within a day or so.

## 2024-08-15 ENCOUNTER — Encounter: Admitting: Physical Medicine & Rehabilitation

## 2024-08-18 ENCOUNTER — Encounter: Payer: Self-pay | Admitting: Physical Medicine and Rehabilitation

## 2024-08-18 ENCOUNTER — Encounter: Attending: Physical Medicine and Rehabilitation | Admitting: Physical Medicine and Rehabilitation

## 2024-08-18 VITALS — BP 134/88 | Ht 67.0 in | Wt 196.0 lb

## 2024-08-18 DIAGNOSIS — R252 Cramp and spasm: Secondary | ICD-10-CM | POA: Insufficient documentation

## 2024-08-18 DIAGNOSIS — Z978 Presence of other specified devices: Secondary | ICD-10-CM | POA: Diagnosis present

## 2024-08-18 DIAGNOSIS — G114 Hereditary spastic paraplegia: Secondary | ICD-10-CM | POA: Diagnosis present

## 2024-08-18 MED ORDER — NITROGLYCERIN 0.3 MG SL SUBL: 0.3000 mg | SUBLINGUAL_TABLET | SUBLINGUAL | 5 refills | Status: AC | PRN

## 2024-08-18 MED ORDER — BACLOFEN 40000 MCG/20ML IT SOLN
40000.0000 ug | Freq: Once | INTRATHECAL | Status: AC
  Administered 2024-08-18: 80000 ug via INTRATHECAL

## 2024-08-18 NOTE — Progress Notes (Signed)
  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  Here for f/u  on HSP/SCI and ITB pump refill       Spasticity isn't better - maybe slightly better.  Neither with Botox - didn't seem to help much.   Thinks it could be cold weather and or pain.   Autonomic dysfunction- been off the chart'- BP has been really high Takes Metoprolol - and calms down.   More B/L knee pain- sets off AD.  And Shoulder since surgery 11/11- has debridement /arthroscopy with decompression.   Back is still doing that arch thing- like someone running a knife into his back  1-2x/week.    Hasn't had valium  lately- for priapism Last Rx for Norco from me 10/12/23, but got an Rx from surgeon after shoulder surgery.     Plan: Con't Lyrica - 50 mg at bedtime -made drowsy during day.   2.  Needs refill of Nitroglycerin   - refill sent-   1305 mcg/day basal Using  only 1383.8  mcg/day  3. Baclofen  Pump Refill. (Synchromed II)    After informed consent and interrogation of the patient's baclofen  pump to confirm residual volume, rate, concentration, etc, his abdomen was prepped with betadine. Baclofen  kit was then opened at beside. Sterile technique was utilized to dawn gloves and drape the area. Using the plastic guide, I localized the fill port and inserted a needle with clamped line into the pump. Next, the remaining baclofen  was removed and the volume was compatible with interrogation device 9ml interrogated, but didn't use all boluses  (12ml removed). Thereafter, baclofen  was drawn into the syringe from the supplied vial. A filter was applied to syringe and excess air was expelled through the filter.. I then attached the syringe/filter to the clamped line .  Then the line was unclamped and with slow, gradual pressure I replaced  20cc of  intrathecal baclofen  (1 ampule of 40 mg/20 mL (2000 mcg/mL)) into the pump. The line was re-clamped and I removed the needle from the abdomen.  The area was then cleaned and dressing was applied. .  After completing the fill, the pump was reinterrogated to confirm concentration/volume/rate.  At 1301.5 and  max 1651.3- We decided to increase his rate to - but couldn't when tried to , because already has appt on 10/03/24. With max dose 1782.  His alarm date is now 10/03/24.  My PTM- bolus 60 mcg;  6 boluses/hour max and 6 boluses /day max-  Pt will follow up here the week prior to his alarm date.    Of note, everything went great, but again, appeared like didn't empty last 3ml- and wouldn't take more than 37 ml-    4. Refill is 1/9 and appt is 10/03/24-    I spent a total of  46  minutes on total care today- >50% coordination of care- due to  discussing when has problems with pump- reminding him if he misses refill, he will go into withdrawal- went over withdrawal S'xs of ITB pump withdrawal- itching, coma, death- can NOT miss appt.

## 2024-08-20 ENCOUNTER — Encounter: Admitting: Physical Medicine and Rehabilitation

## 2024-08-20 ENCOUNTER — Ambulatory Visit: Admitting: Physical Medicine and Rehabilitation

## 2024-08-25 ENCOUNTER — Encounter: Admitting: Physical Medicine & Rehabilitation

## 2024-10-03 ENCOUNTER — Encounter: Payer: Self-pay | Admitting: Physical Medicine and Rehabilitation

## 2024-10-03 ENCOUNTER — Encounter: Attending: Physical Medicine and Rehabilitation | Admitting: Physical Medicine and Rehabilitation

## 2024-10-03 VITALS — BP 126/86 | HR 105 | Ht 67.0 in | Wt 196.0 lb

## 2024-10-03 DIAGNOSIS — M62838 Other muscle spasm: Secondary | ICD-10-CM | POA: Insufficient documentation

## 2024-10-03 DIAGNOSIS — Z9689 Presence of other specified functional implants: Secondary | ICD-10-CM | POA: Insufficient documentation

## 2024-10-03 DIAGNOSIS — R252 Cramp and spasm: Secondary | ICD-10-CM | POA: Diagnosis not present

## 2024-10-03 DIAGNOSIS — G114 Hereditary spastic paraplegia: Secondary | ICD-10-CM | POA: Insufficient documentation

## 2024-10-03 MED ORDER — BACLOFEN 40000 MCG/20ML IT SOLN
40000.0000 ug | Freq: Once | INTRATHECAL | Status: AC
  Administered 2024-10-03: 40000 ug via INTRATHECAL

## 2024-10-03 MED ORDER — TRAMADOL HCL 50 MG PO TABS: 50.0000 mg | ORAL_TABLET | Freq: Four times a day (QID) | ORAL | 0 refills | Status: AC | PRN

## 2024-10-03 NOTE — Progress Notes (Signed)
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  Here for f/u  on SCI and ITB pump refill     Went up on dose and using PTM dosing a lot more-  a little better.  Dealing with bad neck pain- radiating down to both arms- back to back Causing a lot of HA's. 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   Wondering what to do for HA's.   Surgery on L knee and then R knee-  Going to do arthroscopy on B/L knees- explore and clean it out.  So knees also hurting.   R shoulder surgery 08/05/24- and shoulder doing fine, but neck started acting up.   Got billy shoes- that unzip so easier to get on and off.     1.Baclofen  Pump Refill. (Synchromed II)    After informed consent and interrogation of the patient's baclofen  pump to confirm residual volume, rate, concentration, etc, his abdomen was prepped with betadine. Baclofen  kit was then opened at beside. Sterile technique was utilized to dawn gloves and drape the area. Using the plastic guide, I localized the fill port and inserted a needle with clamped line into the pump. Next, the remaining baclofen  was removed and the volume was compatible with interrogation device (15 ml ( was supposed tp be 7.5 but uses my PTM-). Thereafter, baclofen  was drawn into the syringe from the supplied vial. A filter was applied to syringe and excess air was expelled through the filter.. I then attached the syringe/filter to the clamped line .  Then the line was unclamped and with slow, gradual pressure I replaced  40cc of intrathecal baclofen  (1 ampule of 40 mg/20 mL (2000 mcg/mL)) into the pump. The line was re-clamped and I removed the needle from the abdomen.  The area was then cleaned and dressing was applied. .  After completing the fill, the pump was reinterrogated to confirm  concentration/volume/rate. We decided to not increase his rate   mcg/day. His alarm date is now 11/18/24.  Pt will follow up here the week prior to his alarm date.    2. Gets Norco- from one of surgeries- cannot take and drive- Will try 1 week of Tramadol  50 mg up to 4x/day as needed for neck and knee pain-    3. Will need to be refilled before 11/18/24.    4. F/U 11/07/24.   I spent a total of 47   minutes on total care today- >50% coordination of care- due to d/w pt about pain issues and HA's- and decided for tramadol - we discussed options to increase dos eon pump -decided against it       "

## 2024-10-03 NOTE — Patient Instructions (Signed)
 1.Baclofen  Pump Refill. (Synchromed II)    After informed consent and interrogation of the patient's baclofen  pump to confirm residual volume, rate, concentration, etc, his abdomen was prepped with betadine. Baclofen  kit was then opened at beside. Sterile technique was utilized to dawn gloves and drape the area. Using the plastic guide, I localized the fill port and inserted a needle with clamped line into the pump. Next, the remaining baclofen  was removed and the volume was compatible with interrogation device (15 ml ( was supposed tp be 7.5 but uses my PTM-). Thereafter, baclofen  was drawn into the syringe from the supplied vial. A filter was applied to syringe and excess air was expelled through the filter.. I then attached the syringe/filter to the clamped line .  Then the line was unclamped and with slow, gradual pressure I replaced  40cc of intrathecal baclofen  (1 ampule of 40 mg/20 mL (2000 mcg/mL)) into the pump. The line was re-clamped and I removed the needle from the abdomen.  The area was then cleaned and dressing was applied. .  After completing the fill, the pump was reinterrogated to confirm concentration/volume/rate. We decided to not increase his rate   mcg/day. His alarm date is now 11/18/24.  Pt will follow up here the week prior to his alarm date.    2. Gets Norco- from one of surgeries- cannot take and drive- Will try 1 week of Tramadol  50 mg up to 4x/day as needed for neck and knee pain-    3. Will need to be refilled before 11/18/24.    4. F/U 11/07/24.

## 2024-10-23 ENCOUNTER — Encounter: Admitting: Physical Medicine & Rehabilitation

## 2024-10-31 ENCOUNTER — Encounter: Admitting: Physical Medicine & Rehabilitation

## 2024-11-07 ENCOUNTER — Ambulatory Visit: Admitting: Physical Medicine and Rehabilitation

## 2024-12-09 ENCOUNTER — Encounter: Admitting: Physical Medicine & Rehabilitation

## 2024-12-12 ENCOUNTER — Encounter: Admitting: Physical Medicine and Rehabilitation

## 2025-01-16 ENCOUNTER — Encounter: Admitting: Physical Medicine and Rehabilitation

## 2025-02-20 ENCOUNTER — Encounter: Admitting: Physical Medicine and Rehabilitation

## 2025-03-25 ENCOUNTER — Encounter: Admitting: Physical Medicine and Rehabilitation

## 2025-05-01 ENCOUNTER — Encounter: Admitting: Physical Medicine and Rehabilitation

## 2025-06-05 ENCOUNTER — Encounter: Admitting: Physical Medicine and Rehabilitation

## 2025-07-10 ENCOUNTER — Encounter: Admitting: Physical Medicine and Rehabilitation

## 2025-08-19 ENCOUNTER — Encounter: Admitting: Physical Medicine and Rehabilitation

## 2025-09-23 ENCOUNTER — Encounter: Admitting: Physical Medicine and Rehabilitation
